# Patient Record
Sex: Female | Born: 1980 | Race: White | Hispanic: No | Marital: Single | Smoking: Current every day smoker
Health system: Southern US, Community
[De-identification: ages and names within clinical notes are randomized; demographics above are authoritative.]

## PROBLEM LIST (undated history)

## (undated) DIAGNOSIS — M1990 Unspecified osteoarthritis, unspecified site: Secondary | ICD-10-CM

## (undated) HISTORY — PX: CERVICAL CONE BIOPSY: SUR198

---

## 2011-02-27 ENCOUNTER — Emergency Department (HOSPITAL_COMMUNITY): Payer: Medicaid Other

## 2011-02-27 ENCOUNTER — Encounter (HOSPITAL_COMMUNITY): Payer: Self-pay | Admitting: *Deleted

## 2011-02-27 ENCOUNTER — Emergency Department (HOSPITAL_COMMUNITY)
Admission: EM | Admit: 2011-02-27 | Discharge: 2011-02-27 | Disposition: A | Discharge status: Home / Self Care | Payer: Medicaid Other | Attending: Emergency Medicine | Admitting: Emergency Medicine

## 2011-02-27 DIAGNOSIS — Y92009 Unspecified place in unspecified non-institutional (private) residence as the place of occurrence of the external cause: Secondary | ICD-10-CM | POA: Insufficient documentation

## 2011-02-27 DIAGNOSIS — F17200 Nicotine dependence, unspecified, uncomplicated: Secondary | ICD-10-CM | POA: Insufficient documentation

## 2011-02-27 DIAGNOSIS — W19XXXA Unspecified fall, initial encounter: Secondary | ICD-10-CM | POA: Insufficient documentation

## 2011-02-27 DIAGNOSIS — S93409A Sprain of unspecified ligament of unspecified ankle, initial encounter: Secondary | ICD-10-CM | POA: Insufficient documentation

## 2011-02-27 MED ORDER — HYDROCODONE-ACETAMINOPHEN 5-325 MG PO TABS: ORAL_TABLET | ORAL | Status: DC

## 2011-02-27 MED ORDER — PROMETHAZINE HCL 12.5 MG PO TABS
12.5000 mg | ORAL_TABLET | Freq: Once | ORAL | Status: AC
  Administered 2011-02-27: 12.5 mg via ORAL
  Filled 2011-02-27: qty 1

## 2011-02-27 MED ORDER — HYDROCODONE-ACETAMINOPHEN 5-325 MG PO TABS
2.0000 | ORAL_TABLET | Freq: Once | ORAL | Status: AC
  Administered 2011-02-27: 2 via ORAL
  Filled 2011-02-27: qty 2

## 2011-02-27 MED ORDER — IBUPROFEN 800 MG PO TABS
800.0000 mg | ORAL_TABLET | Freq: Once | ORAL | Status: AC
  Administered 2011-02-27: 800 mg via ORAL
  Filled 2011-02-27: qty 1

## 2011-02-27 MED ORDER — MELOXICAM 7.5 MG PO TABS: ORAL_TABLET | ORAL | Status: DC

## 2011-02-27 NOTE — ED Provider Notes (Signed)
History     CSN: [id number]  Arrival date & time 02/27/11  1211   None     Chief Complaint  Patient presents with  . Ankle Pain    (Consider location/radiation/quality/duration/timing/severity/associated sxs/prior treatment) Patient is a 31 y.o. female presenting with ankle pain. The history is provided by the patient.  Ankle Pain  The incident occurred yesterday. The incident occurred at home. The injury mechanism was a fall. The pain is present in the left ankle. The pain is severe. The pain has been constant since onset. Associated symptoms include inability to bear weight. She reports no foreign bodies present. The symptoms are aggravated by bearing weight. She has tried nothing for the symptoms.    History reviewed. No pertinent past medical history.  No past surgical history on file.  No family history on file.  History  Substance Use Topics  . Smoking status: Current Everyday Smoker -- 1.0 packs/day    Types: Cigarettes  . Smokeless tobacco: Not on file  . Alcohol Use: No    OB History    Grav Para Term Preterm Abortions TAB SAB Ect Mult Living                  Review of Systems  Constitutional: Negative for activity change.       All ROS Neg except as noted in HPI  HENT: Negative for nosebleeds and neck pain.   Eyes: Negative for photophobia and discharge.  Respiratory: Negative for cough, shortness of breath and wheezing.   Cardiovascular: Negative for chest pain and palpitations.  Gastrointestinal: Negative for abdominal pain and blood in stool.  Genitourinary: Negative for dysuria, frequency and hematuria.  Musculoskeletal: Negative for back pain and arthralgias.  Skin: Negative.   Neurological: Negative for dizziness, seizures and speech difficulty.  Psychiatric/Behavioral: Negative for hallucinations and confusion.    Allergies  Review of patient's allergies indicates no known allergies.  Home Medications   Current Outpatient Rx  Name Route  Sig Dispense Refill  . HYDROCODONE-ACETAMINOPHEN 5-325 MG PO TABS  1 or 2 po q4h prn pain 20 tablet 0  . MELOXICAM 7.5 MG PO TABS  1 po bid with food 14 tablet 0    BP 128/89  Pulse 100  Temp 97.9 F (36.6 C)  Resp 20  Ht 5\' 4"  (1.626 m)  Wt 198 lb (89.812 kg)  BMI 33.99 kg/m2  SpO2 100%  LMP 02/17/2011  Physical Exam  Nursing note and vitals reviewed. Constitutional: She is oriented to person, place, and time. She appears well-developed and well-nourished.  Non-toxic appearance.  HENT:  Head: Normocephalic.  Right Ear: Tympanic membrane and external ear normal.  Left Ear: Tympanic membrane and external ear normal.  Eyes: EOM and lids are normal. Pupils are equal, round, and reactive to light.  Neck: Normal range of motion. Neck supple. Carotid bruit is not present.  Cardiovascular: Regular rhythm, normal heart sounds, intact distal pulses and normal pulses.   Pulmonary/Chest: Breath sounds normal. No respiratory distress.  Abdominal: Soft. Bowel sounds are normal. There is no tenderness. There is no guarding.  Musculoskeletal: Normal range of motion.       There is swelling and bruising of the left lateral malleolus. There is good capillary refill of the toes. The Achilles tendon is intact. There is soreness and mild swelling of the left knee. The patella tracks well. No deformity of the quads.  Lymphadenopathy:       Head (right side): No submandibular adenopathy present.  Head (left side): No submandibular adenopathy present.    She has no cervical adenopathy.  Neurological: She is alert and oriented to person, place, and time. She has normal strength. No cranial nerve deficit or sensory deficit.  Skin: Skin is warm and dry.  Psychiatric: She has a normal mood and affect. Her speech is normal.    ED Course  Procedures (including critical care time) Pulse oximetry 100% on room air. Within normal limits by my interpretation. Labs Reviewed - No data to display Dg Ankle  Complete Left  02/27/2011  *RADIOLOGY REPORT*  Clinical Data: 31 year old female with left ankle pain following injury.  LEFT ANKLE COMPLETE - 3+ VIEW  Comparison: None  Findings: No evidence of acute fracture, subluxation or dislocation identified.  No radio-opaque foreign bodies are present.  No focal bony lesions are noted.  The joint spaces are unremarkable.  Soft tissue swelling laterally is identified.  IMPRESSION: Soft tissue swelling without acute bony abnormality.  Original Report Authenticated By: Rosendo Gros, M.D.     1. Ankle sprain       MDM  I have reviewed nursing notes, vital signs, and all appropriate lab and imaging results for this patient. Test results, and exam results discussed with the patient in detail. The plan at this time is an ankle splint, ice, elevation, and use of crutches. Prescription for Norco 5 mg, and Mobic 7.5 mg given to the patient. The patient is to be followed by orthopedics if this is not improving.       Kathie Dike, [place] 02/27/11 1359
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
## 2011-02-27 NOTE — ED Notes (Signed)
MD at bedside. 
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
## 2011-02-27 NOTE — Discharge Instructions (Signed)
Please apply ice and elevate your ankle. Your xrays are negative for fracture or dislocation. Please see the orthopedic MD listed above if not improving. Mobic daily with food for inflammation and swelling. Norco for pain if needed. This medication may cause drowsiness.Ankle Sprain You have a sprained ankle. When you twist or sprain your ankle, the ligaments that hold the joint together are injured. This usually causes a lot of swelling and pain. Although these injuries can be quite severe, proper treatment will reduce your pain, shorten the period of disability, and help prevent re-injury. To treat a sprained ankle you should:  Elevate your ankle for the next 2 to 4 days.   During this period apply ice packs to the injury for 20 to 30 minutes every 2 to 3 hours.   Keep the ankle wrapped in a compression bandage or splint as long as it is painful or swollen.   Do not walk on your ankle if it still hurts. This can slow the healing. Gentle range of motion exercises, however, can help decrease disability.   Use crutches if necessary until weight bearing is painless.   Prescription pain medicine may be needed to relieve discomfort.  A plaster or fiberglass splint may be applied initially. As your sprain improves, air, foam or gel-lined braces can be used to protect the ankle from further injury until the joint is completely healed. Ankle rehabilitation exercises may also be used to speed your recovery and make the joint more stable. Most moderate ankle sprains will heal completely in 6 weeks. However, if the sprain is severe, a cast or even surgery may be needed. Restrict your activities and see your doctor for follow-up as advised. If you have persistent pain, further evaluation and x-rays may be needed. Document Released: 01/30/2004 Document Revised: 09/03/2010 Document Reviewed: 12/24/2007 [facility] Patient Information 2012 [place], [place].

## 2011-02-28 NOTE — ED Provider Notes (Signed)
Medical screening examination/treatment/procedure(s) were performed by non-physician practitioner and as supervising physician I was immediately available for consultation/collaboration.  Shelda Jakes, MD 02/28/11 [phone]

## 2011-07-28 ENCOUNTER — Emergency Department (HOSPITAL_COMMUNITY): Payer: Medicaid Other

## 2011-07-28 ENCOUNTER — Encounter (HOSPITAL_COMMUNITY): Payer: Self-pay

## 2011-07-28 ENCOUNTER — Emergency Department (HOSPITAL_COMMUNITY)
Admission: EM | Admit: 2011-07-28 | Discharge: 2011-07-28 | Disposition: A | Discharge status: Home / Self Care | Payer: Medicaid Other | Attending: Emergency Medicine | Admitting: Emergency Medicine

## 2011-07-28 DIAGNOSIS — R6884 Jaw pain: Secondary | ICD-10-CM | POA: Insufficient documentation

## 2011-07-28 DIAGNOSIS — R209 Unspecified disturbances of skin sensation: Secondary | ICD-10-CM | POA: Insufficient documentation

## 2011-07-28 DIAGNOSIS — H538 Other visual disturbances: Secondary | ICD-10-CM | POA: Insufficient documentation

## 2011-07-28 DIAGNOSIS — F17200 Nicotine dependence, unspecified, uncomplicated: Secondary | ICD-10-CM | POA: Insufficient documentation

## 2011-07-28 DIAGNOSIS — S0003XA Contusion of scalp, initial encounter: Secondary | ICD-10-CM | POA: Insufficient documentation

## 2011-07-28 DIAGNOSIS — R51 Headache: Secondary | ICD-10-CM | POA: Insufficient documentation

## 2011-07-28 DIAGNOSIS — S0083XA Contusion of other part of head, initial encounter: Secondary | ICD-10-CM

## 2011-07-28 DIAGNOSIS — Q7649 Other congenital malformations of spine, not associated with scoliosis: Secondary | ICD-10-CM | POA: Insufficient documentation

## 2011-07-28 MED ORDER — OXYCODONE-ACETAMINOPHEN 5-325 MG PO TABS
1.0000 | ORAL_TABLET | Freq: Once | ORAL | Status: AC
  Administered 2011-07-28: 1 via ORAL
  Filled 2011-07-28: qty 1

## 2011-07-28 MED ORDER — OXYCODONE-ACETAMINOPHEN 5-325 MG PO TABS: 1.0000 | ORAL_TABLET | Freq: Four times a day (QID) | ORAL | Status: AC | PRN
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
## 2011-07-28 NOTE — ED Notes (Signed)
Patient requesting pain medication. Dr Deretha Emory aware, awaiting orders.
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
## 2011-12-20 ENCOUNTER — Emergency Department (HOSPITAL_COMMUNITY): Payer: Medicaid Other

## 2011-12-20 ENCOUNTER — Encounter (HOSPITAL_COMMUNITY): Payer: Self-pay | Admitting: Emergency Medicine

## 2011-12-20 ENCOUNTER — Emergency Department (HOSPITAL_COMMUNITY)
Admission: EM | Admit: 2011-12-20 | Discharge: 2011-12-20 | Disposition: A | Discharge status: Home / Self Care | Payer: Medicaid Other | Attending: Emergency Medicine | Admitting: Emergency Medicine

## 2011-12-20 DIAGNOSIS — R51 Headache: Secondary | ICD-10-CM

## 2011-12-20 DIAGNOSIS — Z79899 Other long term (current) drug therapy: Secondary | ICD-10-CM | POA: Insufficient documentation

## 2011-12-20 DIAGNOSIS — F17200 Nicotine dependence, unspecified, uncomplicated: Secondary | ICD-10-CM | POA: Insufficient documentation

## 2011-12-20 MED ORDER — OXYCODONE-ACETAMINOPHEN 5-325 MG PO TABS
2.0000 | ORAL_TABLET | Freq: Once | ORAL | Status: AC
  Administered 2011-12-20: 2 via ORAL
  Filled 2011-12-20: qty 2

## 2011-12-20 NOTE — ED Notes (Signed)
Patient resting with eyes closed. NAD noted.
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
## 2011-12-20 NOTE — ED Notes (Signed)
Dr Ray at bedside. 
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
## 2011-12-20 NOTE — ED Notes (Signed)
Patient noted to be sitting in bed, smiling, holding a conversation with family. No distress noted. Will continue to monitor.

## 2013-08-01 ENCOUNTER — Ambulatory Visit: Payer: Medicaid Other | Admitting: Neurology

## 2013-08-15 ENCOUNTER — Emergency Department (HOSPITAL_COMMUNITY)
Admission: EM | Admit: 2013-08-15 | Discharge: 2013-08-15 | Discharge status: Against Medical Advice | Payer: Medicaid Other | Attending: Emergency Medicine | Admitting: Emergency Medicine

## 2013-08-15 ENCOUNTER — Encounter (HOSPITAL_COMMUNITY): Payer: Self-pay | Admitting: Emergency Medicine

## 2013-08-15 DIAGNOSIS — M549 Dorsalgia, unspecified: Secondary | ICD-10-CM | POA: Insufficient documentation

## 2013-08-15 DIAGNOSIS — M1990 Unspecified osteoarthritis, unspecified site: Secondary | ICD-10-CM | POA: Insufficient documentation

## 2013-08-15 DIAGNOSIS — F17200 Nicotine dependence, unspecified, uncomplicated: Secondary | ICD-10-CM | POA: Insufficient documentation

## 2013-08-15 HISTORY — DX: Unspecified osteoarthritis, unspecified site: M19.90

## 2013-08-15 LAB — URINALYSIS, ROUTINE W REFLEX MICROSCOPIC
BILIRUBIN URINE: NEGATIVE
Glucose, UA: NEGATIVE mg/dL
KETONES UR: NEGATIVE mg/dL
NITRITE: NEGATIVE
Protein, ur: NEGATIVE mg/dL
Specific Gravity, Urine: <1.005 — ABNORMAL LOW (ref 1.005–1.030)
UROBILINOGEN UA: 0.2 mg/dL (ref 0.0–1.0)
pH: 6.5 (ref 5.0–8.0)

## 2013-08-15 LAB — RAPID URINE DRUG SCREEN, HOSP PERFORMED
Amphetamines: POSITIVE — AB
BARBITURATES: NOT DETECTED
Benzodiazepines: POSITIVE — AB
Cocaine: NOT DETECTED
Opiates: NOT DETECTED
Tetrahydrocannabinol: NOT DETECTED

## 2013-08-15 LAB — URINE MICROSCOPIC-ADD ON

## 2013-08-15 NOTE — ED Notes (Signed)
Patient left without seeing the md. Stated she was leaving. Informed her that the md would be in as soon as  Possible  But she left without sign ama papers

## 2013-08-15 NOTE — ED Notes (Addendum)
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  States that she ran out of her medication.  Pt states that [place] months ago and is unable to get to pain management physician.  Pt reports that she takes Dilaudid every 8 hours for pain, and Methadone for breakthrough pain.

## 2014-06-03 IMAGING — CT CT HEAD W/O CM
1 series · 16 of 30 positions shown, 20 images · non-contrast
Comparison: CT scan dated 07/28/2011

CLINICAL DATA: Persistent headaches since trauma in July 2011

CT HEAD WITHOUT CONTRAST
TECHNIQUE: Contiguous axial images were obtained from the base of
the skull through the vertex without contrast.

[Series 2: headtrauma 4.8 h37s · axial · 0.45mm/px · z∈[+78,+232]mm · 16 of 36 slices shown, 20 images]
[im 2/36  brain]
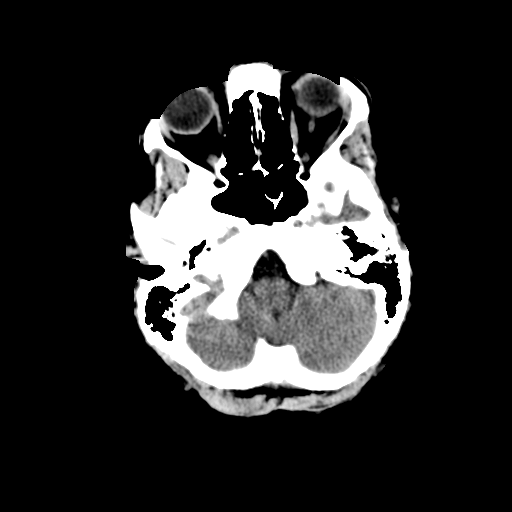
[im 2/36  bone]
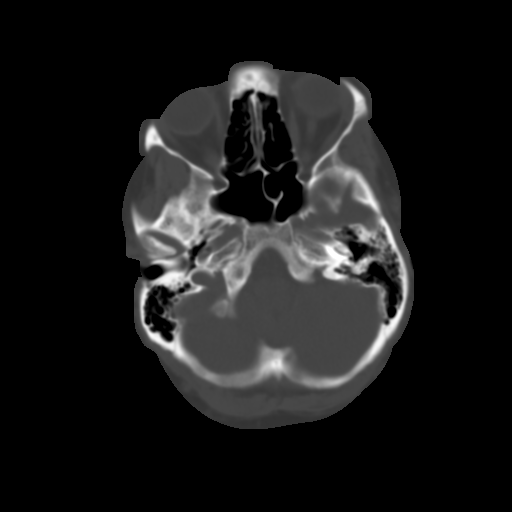
[im 4/36  brain]
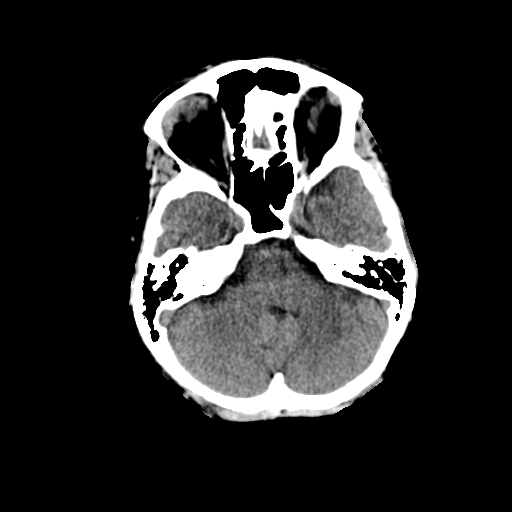
[im 7/36  brain]
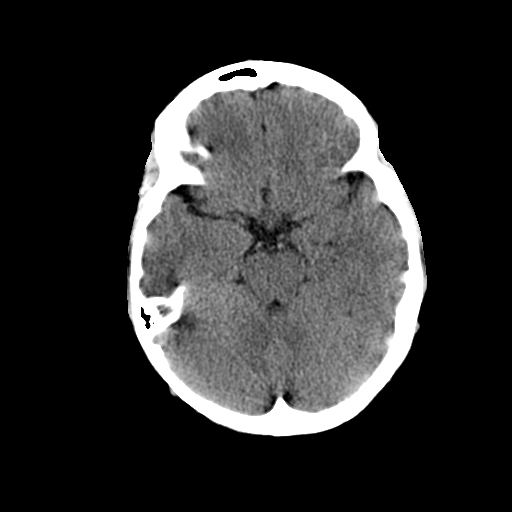
[im 9/36  brain]
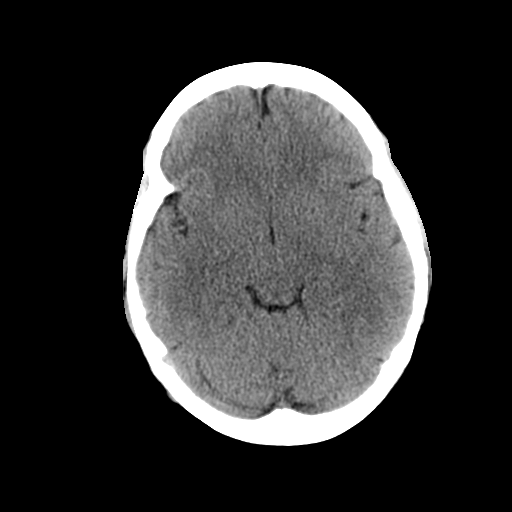
[im 10/36  brain]
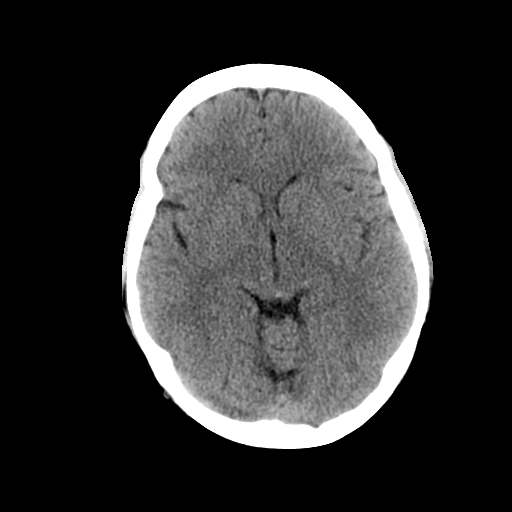
[im 10/36  bone]
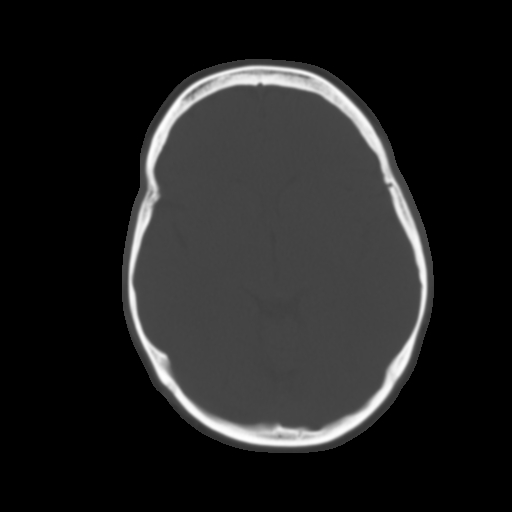
[im 13/36  brain]
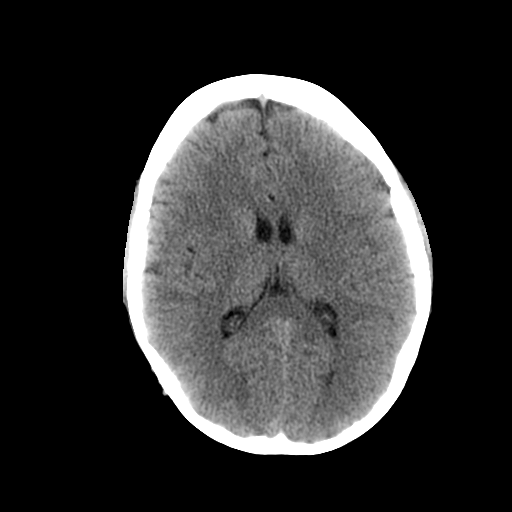
[im 15/36  brain]
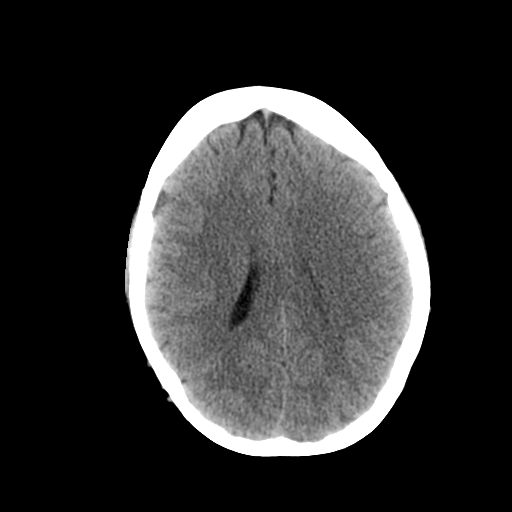
[im 17/36  brain]
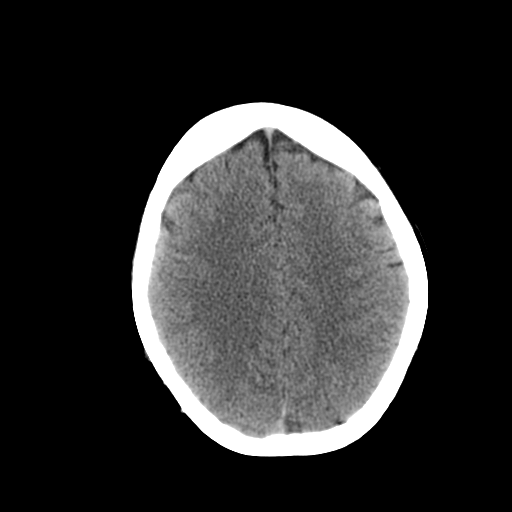
[im 19/36  brain]
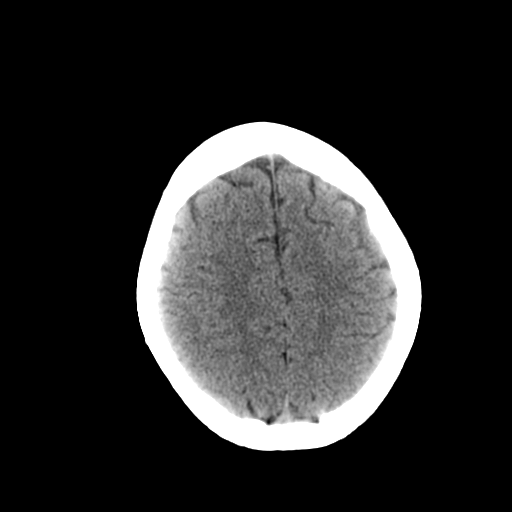
[im 19/36  bone]
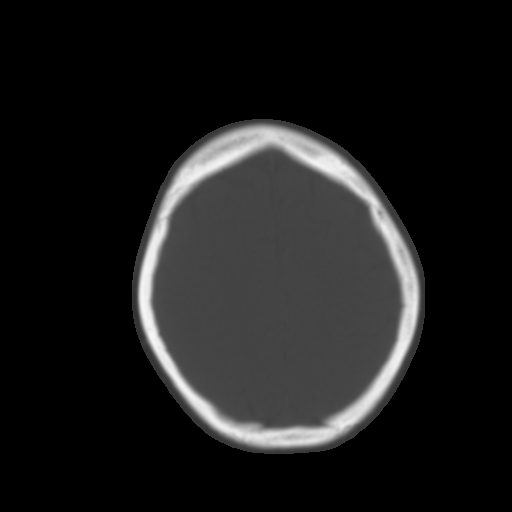
[im 21/36  brain]
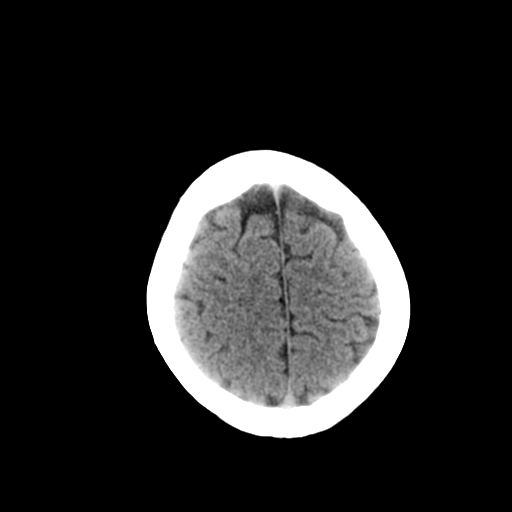
[im 23/36  brain]
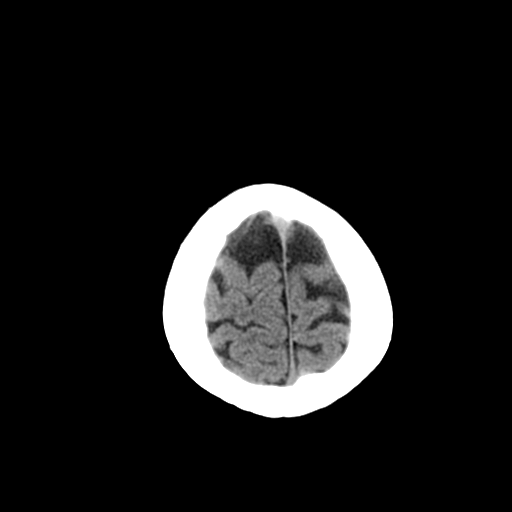
[im 26/36  brain]
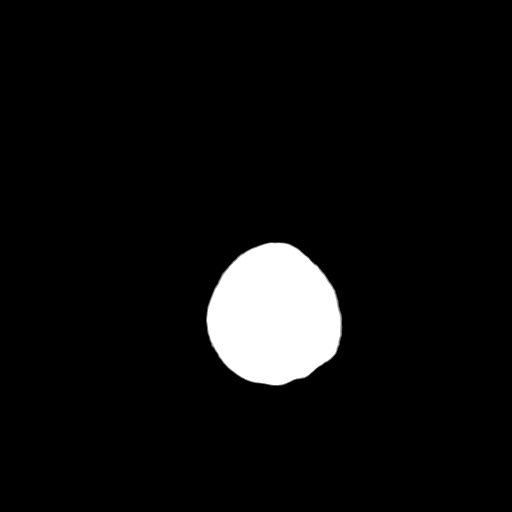
[im 27/36  brain]
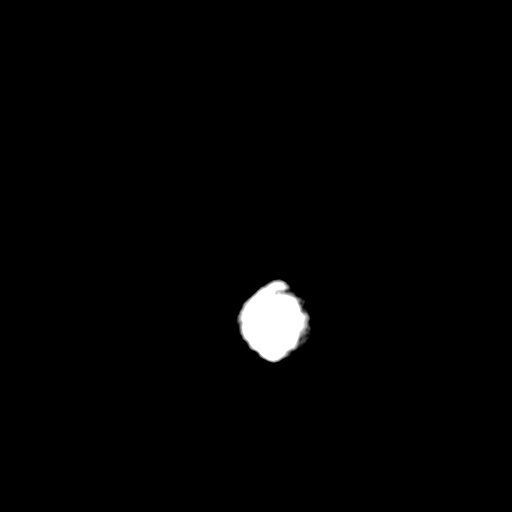
[im 27/36  bone]
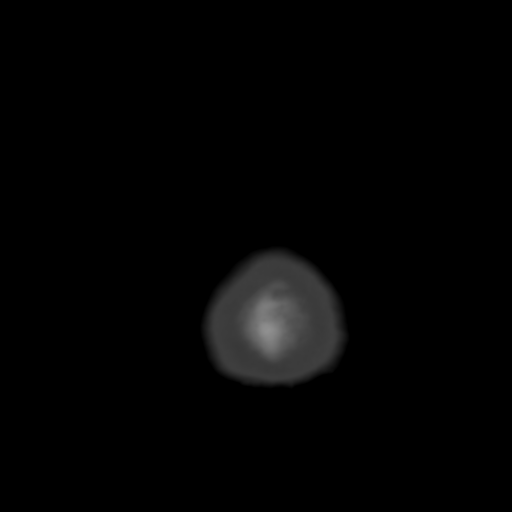
[im 29/36  brain]
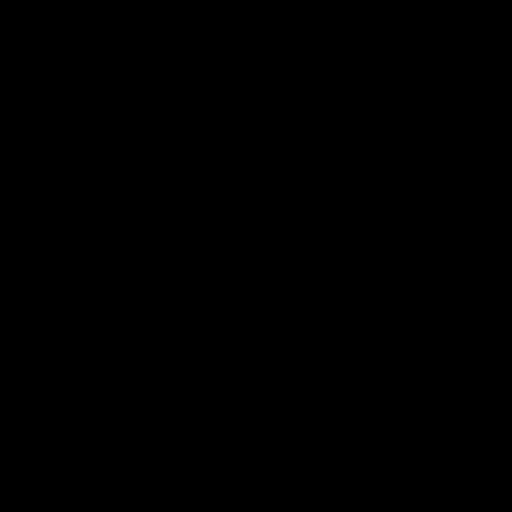
[im 32/36  brain]
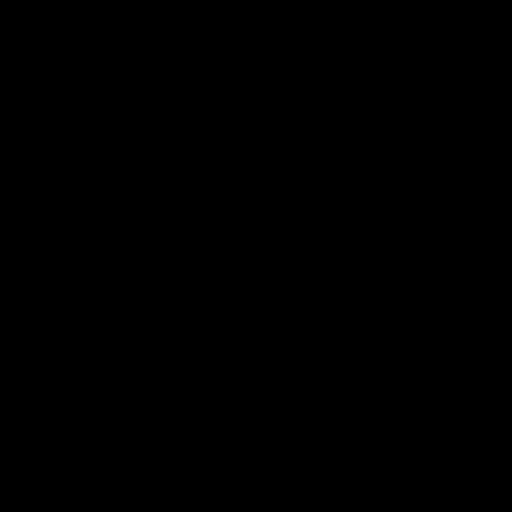
[im 34/36  brain]
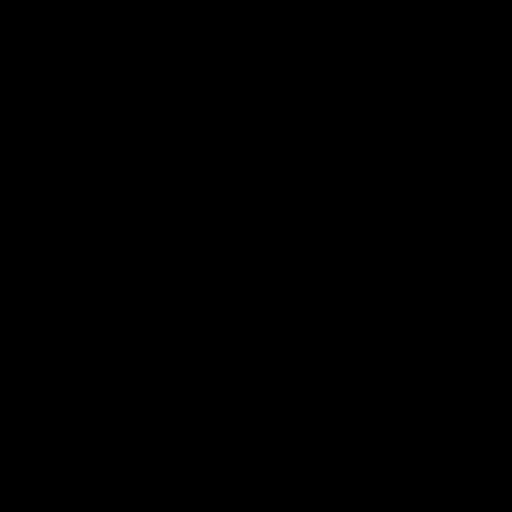

[16 of 30 positions shown; findings below may reference images not displayed]

FINDINGS: There is no acute intracranial hemorrhage, infarction, or
mass lesion.  Brain parenchyma is normal.  Ventricles are normal.
Osseous structures are normal.
IMPRESSION: Normal exam.

## 2015-01-22 ENCOUNTER — Telehealth (HOSPITAL_COMMUNITY): Payer: Self-pay | Admitting: *Deleted

## 2017-04-15 ENCOUNTER — Encounter: Payer: Self-pay | Admitting: Gastroenterology

## 2017-07-01 ENCOUNTER — Telehealth: Payer: Self-pay | Admitting: Gastroenterology

## 2017-07-01 ENCOUNTER — Ambulatory Visit: Payer: Self-pay | Admitting: Gastroenterology

## 2017-07-01 ENCOUNTER — Encounter: Payer: Self-pay | Admitting: Gastroenterology

## 2017-07-01 NOTE — Telephone Encounter (Signed)
PATIENT WAS A NO SHOW AND LETTER SENT  °

## 2021-09-23 DIAGNOSIS — Z79899 Other long term (current) drug therapy: Secondary | ICD-10-CM | POA: Diagnosis not present

## 2021-09-30 DIAGNOSIS — Z79899 Other long term (current) drug therapy: Secondary | ICD-10-CM | POA: Diagnosis not present

## 2021-11-07 DIAGNOSIS — R69 Illness, unspecified: Secondary | ICD-10-CM | POA: Diagnosis not present

## 2021-11-07 DIAGNOSIS — Z79899 Other long term (current) drug therapy: Secondary | ICD-10-CM | POA: Diagnosis not present

## 2021-11-14 DIAGNOSIS — F411 Generalized anxiety disorder: Secondary | ICD-10-CM | POA: Diagnosis not present

## 2021-11-14 DIAGNOSIS — F13120 Sedative, hypnotic or anxiolytic abuse with intoxication, uncomplicated: Secondary | ICD-10-CM | POA: Diagnosis not present

## 2021-11-14 DIAGNOSIS — R69 Illness, unspecified: Secondary | ICD-10-CM | POA: Diagnosis not present

## 2021-11-14 DIAGNOSIS — F900 Attention-deficit hyperactivity disorder, predominantly inattentive type: Secondary | ICD-10-CM | POA: Diagnosis not present

## 2021-11-14 DIAGNOSIS — Z79899 Other long term (current) drug therapy: Secondary | ICD-10-CM | POA: Diagnosis not present

## 2021-11-14 DIAGNOSIS — F32A Depression, unspecified: Secondary | ICD-10-CM | POA: Diagnosis not present

## 2021-11-14 DIAGNOSIS — F1124 Opioid dependence with opioid-induced mood disorder: Secondary | ICD-10-CM | POA: Diagnosis not present

## 2021-11-19 DIAGNOSIS — F1124 Opioid dependence with opioid-induced mood disorder: Secondary | ICD-10-CM | POA: Diagnosis not present

## 2021-11-19 DIAGNOSIS — Z79899 Other long term (current) drug therapy: Secondary | ICD-10-CM | POA: Diagnosis not present

## 2021-11-26 DIAGNOSIS — Z833 Family history of diabetes mellitus: Secondary | ICD-10-CM | POA: Diagnosis not present

## 2021-11-26 DIAGNOSIS — F4310 Post-traumatic stress disorder, unspecified: Secondary | ICD-10-CM | POA: Diagnosis not present

## 2021-11-26 DIAGNOSIS — Z720 Tobacco use: Secondary | ICD-10-CM | POA: Diagnosis not present

## 2021-11-26 DIAGNOSIS — Z6838 Body mass index (BMI) 38.0-38.9, adult: Secondary | ICD-10-CM | POA: Diagnosis not present

## 2021-11-26 DIAGNOSIS — F419 Anxiety disorder, unspecified: Secondary | ICD-10-CM | POA: Diagnosis not present

## 2021-11-26 DIAGNOSIS — I10 Essential (primary) hypertension: Secondary | ICD-10-CM | POA: Diagnosis not present

## 2021-11-26 DIAGNOSIS — R69 Illness, unspecified: Secondary | ICD-10-CM | POA: Diagnosis not present

## 2021-11-26 DIAGNOSIS — F902 Attention-deficit hyperactivity disorder, combined type: Secondary | ICD-10-CM | POA: Diagnosis not present

## 2021-11-26 DIAGNOSIS — Z8249 Family history of ischemic heart disease and other diseases of the circulatory system: Secondary | ICD-10-CM | POA: Diagnosis not present

## 2021-11-26 DIAGNOSIS — Z818 Family history of other mental and behavioral disorders: Secondary | ICD-10-CM | POA: Diagnosis not present

## 2021-12-03 DIAGNOSIS — Z79899 Other long term (current) drug therapy: Secondary | ICD-10-CM | POA: Diagnosis not present

## 2021-12-03 DIAGNOSIS — F1124 Opioid dependence with opioid-induced mood disorder: Secondary | ICD-10-CM | POA: Diagnosis not present

## 2021-12-10 DIAGNOSIS — Z79899 Other long term (current) drug therapy: Secondary | ICD-10-CM | POA: Diagnosis not present

## 2021-12-10 DIAGNOSIS — R69 Illness, unspecified: Secondary | ICD-10-CM | POA: Diagnosis not present

## 2021-12-15 DIAGNOSIS — F1124 Opioid dependence with opioid-induced mood disorder: Secondary | ICD-10-CM | POA: Diagnosis not present

## 2021-12-15 DIAGNOSIS — Z79899 Other long term (current) drug therapy: Secondary | ICD-10-CM | POA: Diagnosis not present

## 2021-12-22 DIAGNOSIS — Z5181 Encounter for therapeutic drug level monitoring: Secondary | ICD-10-CM | POA: Diagnosis not present

## 2021-12-22 DIAGNOSIS — F32A Depression, unspecified: Secondary | ICD-10-CM | POA: Diagnosis not present

## 2021-12-22 DIAGNOSIS — F900 Attention-deficit hyperactivity disorder, predominantly inattentive type: Secondary | ICD-10-CM | POA: Diagnosis not present

## 2021-12-22 DIAGNOSIS — Z79899 Other long term (current) drug therapy: Secondary | ICD-10-CM | POA: Diagnosis not present

## 2021-12-22 DIAGNOSIS — R69 Illness, unspecified: Secondary | ICD-10-CM | POA: Diagnosis not present

## 2021-12-22 DIAGNOSIS — F1124 Opioid dependence with opioid-induced mood disorder: Secondary | ICD-10-CM | POA: Diagnosis not present

## 2021-12-31 DIAGNOSIS — F13120 Sedative, hypnotic or anxiolytic abuse with intoxication, uncomplicated: Secondary | ICD-10-CM | POA: Diagnosis not present

## 2021-12-31 DIAGNOSIS — F900 Attention-deficit hyperactivity disorder, predominantly inattentive type: Secondary | ICD-10-CM | POA: Diagnosis not present

## 2021-12-31 DIAGNOSIS — Z79891 Long term (current) use of opiate analgesic: Secondary | ICD-10-CM | POA: Diagnosis not present

## 2021-12-31 DIAGNOSIS — Z79899 Other long term (current) drug therapy: Secondary | ICD-10-CM | POA: Diagnosis not present

## 2021-12-31 DIAGNOSIS — F1124 Opioid dependence with opioid-induced mood disorder: Secondary | ICD-10-CM | POA: Diagnosis not present

## 2021-12-31 DIAGNOSIS — R69 Illness, unspecified: Secondary | ICD-10-CM | POA: Diagnosis not present

## 2021-12-31 DIAGNOSIS — F411 Generalized anxiety disorder: Secondary | ICD-10-CM | POA: Diagnosis not present

## 2021-12-31 DIAGNOSIS — F32A Depression, unspecified: Secondary | ICD-10-CM | POA: Diagnosis not present

## 2022-01-07 DIAGNOSIS — F32A Depression, unspecified: Secondary | ICD-10-CM | POA: Diagnosis not present

## 2022-01-07 DIAGNOSIS — R69 Illness, unspecified: Secondary | ICD-10-CM | POA: Diagnosis not present

## 2022-01-07 DIAGNOSIS — F411 Generalized anxiety disorder: Secondary | ICD-10-CM | POA: Diagnosis not present

## 2022-01-07 DIAGNOSIS — F1124 Opioid dependence with opioid-induced mood disorder: Secondary | ICD-10-CM | POA: Diagnosis not present

## 2022-01-07 DIAGNOSIS — Z5181 Encounter for therapeutic drug level monitoring: Secondary | ICD-10-CM | POA: Diagnosis not present

## 2022-01-07 DIAGNOSIS — Z79899 Other long term (current) drug therapy: Secondary | ICD-10-CM | POA: Diagnosis not present

## 2022-01-07 DIAGNOSIS — F900 Attention-deficit hyperactivity disorder, predominantly inattentive type: Secondary | ICD-10-CM | POA: Diagnosis not present

## 2022-01-07 DIAGNOSIS — F13120 Sedative, hypnotic or anxiolytic abuse with intoxication, uncomplicated: Secondary | ICD-10-CM | POA: Diagnosis not present

## 2022-01-13 DIAGNOSIS — F1124 Opioid dependence with opioid-induced mood disorder: Secondary | ICD-10-CM | POA: Diagnosis not present

## 2022-01-13 DIAGNOSIS — R69 Illness, unspecified: Secondary | ICD-10-CM | POA: Diagnosis not present

## 2022-01-13 DIAGNOSIS — Z79899 Other long term (current) drug therapy: Secondary | ICD-10-CM | POA: Diagnosis not present

## 2022-01-13 DIAGNOSIS — F32A Depression, unspecified: Secondary | ICD-10-CM | POA: Diagnosis not present

## 2022-01-13 DIAGNOSIS — F13120 Sedative, hypnotic or anxiolytic abuse with intoxication, uncomplicated: Secondary | ICD-10-CM | POA: Diagnosis not present

## 2022-01-13 DIAGNOSIS — F411 Generalized anxiety disorder: Secondary | ICD-10-CM | POA: Diagnosis not present

## 2022-01-13 DIAGNOSIS — F900 Attention-deficit hyperactivity disorder, predominantly inattentive type: Secondary | ICD-10-CM | POA: Diagnosis not present

## 2022-01-13 DIAGNOSIS — Z5181 Encounter for therapeutic drug level monitoring: Secondary | ICD-10-CM | POA: Diagnosis not present

## 2022-01-19 DIAGNOSIS — F900 Attention-deficit hyperactivity disorder, predominantly inattentive type: Secondary | ICD-10-CM | POA: Diagnosis not present

## 2022-01-19 DIAGNOSIS — Z79899 Other long term (current) drug therapy: Secondary | ICD-10-CM | POA: Diagnosis not present

## 2022-01-19 DIAGNOSIS — R69 Illness, unspecified: Secondary | ICD-10-CM | POA: Diagnosis not present

## 2022-01-19 DIAGNOSIS — F32A Depression, unspecified: Secondary | ICD-10-CM | POA: Diagnosis not present

## 2022-01-19 DIAGNOSIS — F411 Generalized anxiety disorder: Secondary | ICD-10-CM | POA: Diagnosis not present

## 2022-01-19 DIAGNOSIS — Z5181 Encounter for therapeutic drug level monitoring: Secondary | ICD-10-CM | POA: Diagnosis not present

## 2022-01-19 DIAGNOSIS — F13120 Sedative, hypnotic or anxiolytic abuse with intoxication, uncomplicated: Secondary | ICD-10-CM | POA: Diagnosis not present

## 2022-01-19 DIAGNOSIS — F1124 Opioid dependence with opioid-induced mood disorder: Secondary | ICD-10-CM | POA: Diagnosis not present

## 2022-01-26 DIAGNOSIS — F900 Attention-deficit hyperactivity disorder, predominantly inattentive type: Secondary | ICD-10-CM | POA: Diagnosis not present

## 2022-01-26 DIAGNOSIS — Z5181 Encounter for therapeutic drug level monitoring: Secondary | ICD-10-CM | POA: Diagnosis not present

## 2022-01-26 DIAGNOSIS — F13120 Sedative, hypnotic or anxiolytic abuse with intoxication, uncomplicated: Secondary | ICD-10-CM | POA: Diagnosis not present

## 2022-01-26 DIAGNOSIS — F32A Depression, unspecified: Secondary | ICD-10-CM | POA: Diagnosis not present

## 2022-01-26 DIAGNOSIS — F411 Generalized anxiety disorder: Secondary | ICD-10-CM | POA: Diagnosis not present

## 2022-01-26 DIAGNOSIS — F1124 Opioid dependence with opioid-induced mood disorder: Secondary | ICD-10-CM | POA: Diagnosis not present

## 2022-01-26 DIAGNOSIS — Z79899 Other long term (current) drug therapy: Secondary | ICD-10-CM | POA: Diagnosis not present

## 2022-01-26 DIAGNOSIS — R69 Illness, unspecified: Secondary | ICD-10-CM | POA: Diagnosis not present

## 2023-09-26 ENCOUNTER — Emergency Department (HOSPITAL_COMMUNITY): Payer: Medicaid - Out of State

## 2023-09-26 ENCOUNTER — Inpatient Hospital Stay (HOSPITAL_COMMUNITY)
Admission: EM | Admit: 2023-09-26 | Discharge: 2023-09-30 | DRG: 871 | Disposition: A | Discharge status: Home / Self Care | Payer: Medicaid - Out of State | Attending: Internal Medicine | Admitting: Internal Medicine

## 2023-09-26 ENCOUNTER — Other Ambulatory Visit: Payer: Self-pay

## 2023-09-26 ENCOUNTER — Encounter (HOSPITAL_COMMUNITY): Payer: Self-pay

## 2023-09-26 DIAGNOSIS — F17210 Nicotine dependence, cigarettes, uncomplicated: Secondary | ICD-10-CM | POA: Diagnosis present

## 2023-09-26 DIAGNOSIS — R188 Other ascites: Secondary | ICD-10-CM | POA: Diagnosis present

## 2023-09-26 DIAGNOSIS — K828 Other specified diseases of gallbladder: Secondary | ICD-10-CM | POA: Diagnosis present

## 2023-09-26 DIAGNOSIS — K56609 Unspecified intestinal obstruction, unspecified as to partial versus complete obstruction: Secondary | ICD-10-CM | POA: Diagnosis present

## 2023-09-26 DIAGNOSIS — E871 Hypo-osmolality and hyponatremia: Secondary | ICD-10-CM | POA: Diagnosis present

## 2023-09-26 DIAGNOSIS — A419 Sepsis, unspecified organism: Principal | ICD-10-CM | POA: Diagnosis present

## 2023-09-26 DIAGNOSIS — R6521 Severe sepsis with septic shock: Secondary | ICD-10-CM | POA: Diagnosis present

## 2023-09-26 DIAGNOSIS — Z1152 Encounter for screening for COVID-19: Secondary | ICD-10-CM

## 2023-09-26 DIAGNOSIS — Z98891 History of uterine scar from previous surgery: Secondary | ICD-10-CM

## 2023-09-26 DIAGNOSIS — D75839 Thrombocytosis, unspecified: Secondary | ICD-10-CM | POA: Diagnosis present

## 2023-09-26 DIAGNOSIS — N390 Urinary tract infection, site not specified: Secondary | ICD-10-CM | POA: Diagnosis present

## 2023-09-26 DIAGNOSIS — R7989 Other specified abnormal findings of blood chemistry: Secondary | ICD-10-CM | POA: Insufficient documentation

## 2023-09-26 DIAGNOSIS — Z885 Allergy status to narcotic agent status: Secondary | ICD-10-CM | POA: Diagnosis not present

## 2023-09-26 DIAGNOSIS — Z87440 Personal history of urinary (tract) infections: Secondary | ICD-10-CM

## 2023-09-26 DIAGNOSIS — K567 Ileus, unspecified: Secondary | ICD-10-CM | POA: Diagnosis present

## 2023-09-26 DIAGNOSIS — I959 Hypotension, unspecified: Secondary | ICD-10-CM | POA: Diagnosis present

## 2023-09-26 DIAGNOSIS — R6510 Systemic inflammatory response syndrome (SIRS) of non-infectious origin without acute organ dysfunction: Secondary | ICD-10-CM | POA: Diagnosis not present

## 2023-09-26 DIAGNOSIS — N179 Acute kidney failure, unspecified: Secondary | ICD-10-CM | POA: Diagnosis present

## 2023-09-26 DIAGNOSIS — F419 Anxiety disorder, unspecified: Secondary | ICD-10-CM | POA: Diagnosis present

## 2023-09-26 DIAGNOSIS — N12 Tubulo-interstitial nephritis, not specified as acute or chronic: Secondary | ICD-10-CM

## 2023-09-26 DIAGNOSIS — J9811 Atelectasis: Secondary | ICD-10-CM | POA: Diagnosis present

## 2023-09-26 DIAGNOSIS — E872 Acidosis, unspecified: Secondary | ICD-10-CM | POA: Diagnosis present

## 2023-09-26 DIAGNOSIS — G8929 Other chronic pain: Secondary | ICD-10-CM | POA: Diagnosis present

## 2023-09-26 DIAGNOSIS — E861 Hypovolemia: Secondary | ICD-10-CM | POA: Diagnosis not present

## 2023-09-26 LAB — COMPREHENSIVE METABOLIC PANEL WITH GFR
ALT: 11 U/L (ref 0–44)
AST: 16 U/L (ref 15–41)
Albumin: 2.7 g/dL — ABNORMAL LOW (ref 3.5–5.0)
Alkaline Phosphatase: 88 U/L (ref 38–126)
Anion gap: 16 — ABNORMAL HIGH (ref 5–15)
BUN: 32 mg/dL — ABNORMAL HIGH (ref 6–20)
CO2: 22 mmol/L (ref 22–32)
Calcium: 8.7 mg/dL — ABNORMAL LOW (ref 8.9–10.3)
Chloride: 93 mmol/L — ABNORMAL LOW (ref 98–111)
Creatinine, Ser: 2.22 mg/dL — ABNORMAL HIGH (ref 0.44–1.00)
GFR, Estimated: 28 mL/min — ABNORMAL LOW (ref >60)
Glucose, Bld: 176 mg/dL — ABNORMAL HIGH (ref 70–99)
Potassium: 4.2 mmol/L (ref 3.5–5.1)
Sodium: 131 mmol/L — ABNORMAL LOW (ref 135–145)
Total Bilirubin: 1 mg/dL (ref 0.0–1.2)
Total Protein: 7.9 g/dL (ref 6.5–8.1)

## 2023-09-26 LAB — CBC WITH DIFFERENTIAL/PLATELET
Abs Immature Granulocytes: 0.17 K/uL — ABNORMAL HIGH (ref 0.00–0.07)
Basophils Absolute: 0.1 K/uL (ref 0.0–0.1)
Basophils Relative: 0 %
Eosinophils Absolute: 0 K/uL (ref 0.0–0.5)
Eosinophils Relative: 0 %
HCT: 36.8 % (ref 36.0–46.0)
Hemoglobin: 11.9 g/dL — ABNORMAL LOW (ref 12.0–15.0)
Immature Granulocytes: 1 %
Lymphocytes Relative: 7 %
Lymphs Abs: 0.9 K/uL (ref 0.7–4.0)
MCH: 28.5 pg (ref 26.0–34.0)
MCHC: 32.3 g/dL (ref 30.0–36.0)
MCV: 88.2 fL (ref 80.0–100.0)
Monocytes Absolute: 0.4 K/uL (ref 0.1–1.0)
Monocytes Relative: 3 %
Neutro Abs: 12.5 K/uL — ABNORMAL HIGH (ref 1.7–7.7)
Neutrophils Relative %: 89 %
Platelets: 633 K/uL — ABNORMAL HIGH (ref 150–400)
RBC: 4.17 MIL/uL (ref 3.87–5.11)
RDW: 13 % (ref 11.5–15.5)
WBC: 14.3 K/uL — ABNORMAL HIGH (ref 4.0–10.5)
nRBC: 0 % (ref 0.0–0.2)

## 2023-09-26 LAB — URINALYSIS, W/ REFLEX TO CULTURE (INFECTION SUSPECTED)
Glucose, UA: NEGATIVE mg/dL
Ketones, ur: NEGATIVE mg/dL
Nitrite: NEGATIVE
Protein, ur: 100 mg/dL — AB
Specific Gravity, Urine: 1.025 (ref 1.005–1.030)
pH: 5 (ref 5.0–8.0)

## 2023-09-26 LAB — LACTIC ACID, PLASMA: Lactic Acid, Venous: 2.7 mmol/L (ref 0.5–1.9)

## 2023-09-26 LAB — RESP PANEL BY RT-PCR (RSV, FLU A&B, COVID)  RVPGX2
Influenza A by PCR: NEGATIVE
Influenza B by PCR: NEGATIVE
Resp Syncytial Virus by PCR: NEGATIVE
SARS Coronavirus 2 by RT PCR: NEGATIVE

## 2023-09-26 LAB — PROTIME-INR
INR: 1.3 — ABNORMAL HIGH (ref 0.8–1.2)
Prothrombin Time: 16.7 s — ABNORMAL HIGH (ref 11.4–15.2)

## 2023-09-26 LAB — PREGNANCY, URINE: Preg Test, Ur: NEGATIVE

## 2023-09-26 MED ORDER — LACTATED RINGERS IV BOLUS (SEPSIS)
1000.0000 mL | Freq: Once | INTRAVENOUS | Status: AC
  Administered 2023-09-26: 1000 mL via INTRAVENOUS

## 2023-09-26 MED ORDER — ONDANSETRON HCL 4 MG/2ML IJ SOLN
4.0000 mg | Freq: Once | INTRAMUSCULAR | Status: AC
  Administered 2023-09-26: 4 mg via INTRAVENOUS
  Filled 2023-09-26: qty 2

## 2023-09-26 MED ORDER — SODIUM CHLORIDE 0.9 % IV SOLN
2.0000 g | Freq: Once | INTRAVENOUS | Status: AC
  Administered 2023-09-26: 2 g via INTRAVENOUS
  Filled 2023-09-26: qty 20

## 2023-09-26 MED ORDER — LACTATED RINGERS IV SOLN: INTRAVENOUS | Status: DC

## 2023-09-26 NOTE — ED Provider Notes (Signed)
 [facility] AT [facility] Provider Note   CSN: [id number] Arrival date & time: 09/26/23  2034     Patient presents with: Abdominal Pain and Back Pain   Catherine Combs is a 43 y.o. female.    Abdominal Pain Back Pain Associated symptoms: abdominal pain    This patient is a 43 year old female, she has a history of substance abuse with a prior history of opiate abuse, she has a history of being on Subutex  over the last couple of months, she is chronically constipated and states that she can sometimes go weeks without using the bathroom but also reports that she had been diagnosed with a urinary tract infection at her family doctor's office in [place] about 3 weeks ago at which time she was prescribed Bactrim.  She reports that she feels like she got a little bit better, but over the last couple of weeks has had some progressive symptoms including lower abdominal pain, back pain, some urinary abnormalities including small volume frequency and abnormal appearing urine which is cloudy and dark.  She denies fevers or chills, she denies nausea or vomiting, she has felt weaker today.  I have reviewed the medical record and there does not appear to be any of these records from the last month, she does have a history of being followed by substance abuse clinics in the past    Prior to Admission medications   Medication Sig Start Date End Date Taking? Authorizing Provider  ALPRAZolam  (XANAX ) 1 MG tablet Take 1 mg by mouth 3 (three) times daily.    [provider]  amphetamine-dextroamphetamine (ADDERALL) 20 MG tablet Take 20 mg by mouth 2 (two) times daily.    [provider]  HYDROmorphone (DILAUDID) 8 MG tablet Take 8 mg by mouth every 8 (eight) hours as needed for severe pain.    [provider]  methadone (DOLOPHINE) 10 MG tablet Take 10 mg by mouth every 8 (eight) hours.    [provider]    Allergies: Hydrocodone     Review  of Systems  Gastrointestinal:  Positive for abdominal pain.  Musculoskeletal:  Positive for back pain.  All other systems reviewed and are negative.   Updated Vital Signs BP 93/71   Pulse (!) 144   Temp 97.9 F (36.6 C)   Resp (!) 21   SpO2 96%   Physical Exam Vitals and nursing note reviewed.  Constitutional:      Appearance: She is well-developed. She is ill-appearing.  HENT:     Head: Normocephalic and atraumatic.     Mouth/Throat:     Pharynx: No oropharyngeal exudate.  Eyes:     General: No scleral icterus.       Right eye: No discharge.        Left eye: No discharge.     Conjunctiva/sclera: Conjunctivae normal.     Pupils: Pupils are equal, round, and reactive to light.  Neck:     Thyroid: No thyromegaly.     Vascular: No JVD.  Cardiovascular:     Rate and Rhythm: Regular rhythm. Tachycardia present.     Heart sounds: Normal heart sounds. No murmur heard.    No friction rub. No gallop.     Comments: Tachycardic to 140 bpm with thready radial artery pulses Pulmonary:     Effort: Pulmonary effort is normal. No respiratory distress.     Breath sounds: Normal breath sounds. No wheezing or rales.  Abdominal:     General: Bowel  sounds are normal. There is no distension.     Palpations: Abdomen is soft. There is no mass.     Tenderness: There is abdominal tenderness.     Comments: Diffuse abdominal tenderness to palpation but more in the right and left lower quadrants  Musculoskeletal:        General: No tenderness. Normal range of motion.     Cervical back: Normal range of motion and neck supple.     Right lower leg: No edema.     Left lower leg: No edema.  Lymphadenopathy:     Cervical: No cervical adenopathy.  Skin:    General: Skin is warm and dry.     Findings: No erythema or rash.  Neurological:     General: No focal deficit present.     Mental Status: She is alert.     Coordination: Coordination normal.  Psychiatric:        Behavior: Behavior normal.      (all labs ordered are listed, but only abnormal results are displayed) Labs Reviewed  LACTIC ACID, PLASMA - Abnormal; Notable for the following components:      Result Value   Lactic Acid, Venous 2.7 (*)    All other components within normal limits  COMPREHENSIVE METABOLIC PANEL WITH GFR - Abnormal; Notable for the following components:   Sodium 131 (*)    Chloride 93 (*)    Glucose, Bld 176 (*)    BUN 32 (*)    Creatinine, Ser 2.22 (*)    Calcium 8.7 (*)    Albumin 2.7 (*)    GFR, Estimated 28 (*)    Anion gap 16 (*)    All other components within normal limits  CBC WITH DIFFERENTIAL/PLATELET - Abnormal; Notable for the following components:   WBC 14.3 (*)    Hemoglobin 11.9 (*)    Platelets 633 (*)    Neutro Abs 12.5 (*)    Abs Immature Granulocytes 0.17 (*)    All other components within normal limits  PROTIME-INR - Abnormal; Notable for the following components:   Prothrombin Time 16.7 (*)    INR 1.3 (*)    All other components within normal limits  URINALYSIS, W/ REFLEX TO CULTURE (INFECTION SUSPECTED) - Abnormal; Notable for the following components:   Color, Urine AMBER (*)    APPearance CLOUDY (*)    Hgb urine dipstick SMALL (*)    Bilirubin Urine SMALL (*)    Protein, ur 100 (*)    Leukocytes,Ua MODERATE (*)    Bacteria, UA MANY (*)    All other components within normal limits  RESP PANEL BY RT-PCR (RSV, FLU A&B, COVID)  RVPGX2  CULTURE, BLOOD (ROUTINE X 2)  CULTURE, BLOOD (ROUTINE X 2)  PREGNANCY, URINE  LACTIC ACID, PLASMA  POC URINE PREG, ED    EKG: EKG Interpretation Date/Time:  Sunday September 26 2023 21:50:03 EDT Ventricular Rate:  127 PR Interval:  128 QRS Duration:  77 QT Interval:  293 QTC Calculation: 426 R Axis:   61  Text Interpretation: Sinus tachycardia Confirmed by Cleotilde Rogue ([id number]) on 09/26/2023 11:09:24 PM  Radiology: CT ABDOMEN PELVIS WO CONTRAST Result Date: 09/26/2023 CLINICAL DATA:  Left lower quadrant abdominal  pain. EXAM: CT ABDOMEN AND PELVIS WITHOUT CONTRAST TECHNIQUE: Multidetector CT imaging of the abdomen and pelvis was performed following the standard protocol without IV contrast. RADIATION DOSE REDUCTION: This exam was performed according to the departmental dose-optimization program which includes automated exposure control, adjustment of the mA  and/or kV according to patient size and/or use of iterative reconstruction technique. COMPARISON:  None Available. FINDINGS: Lower chest: There is minimal atelectasis in the lung bases. Hepatobiliary: The liver is enlarged. The gallbladder is dilated. Gallstones are likely present. There is mild gallbladder wall edema. There is no biliary ductal dilatation. Pancreas: Unremarkable. No pancreatic ductal dilatation or surrounding inflammatory changes. Spleen: Normal in size without focal abnormality. Adrenals/Urinary Tract: There is a cyst in the right kidney measuring 13 mm. Otherwise, the adrenal glands and kidneys are within normal limits. There is a small amount of air in the bladder. Stomach/Bowel: The stomach is moderately distended. There are multiple dilated small bowel loops measuring up to 4.4 cm in the central and upper abdomen. Transition point is seen centrally in the mid abdomen. Distal small bowel loops are decompressed. Colon is normal in size. Appendix appears normal. There is a large amount of stool throughout the colon. There is no free intraperitoneal air or pneumatosis. Vascular/Lymphatic: No significant vascular findings are present. No enlarged abdominal or pelvic lymph nodes. Reproductive: IUD is present, questionably low-lying. Uterus is otherwise within normal limits. The ovaries are not well defined secondary to free fluid in the pelvis. There is questionable enlarged right ovary. Other: There is a moderate amount of ascites and interloop fluid throughout the abdomen and pelvis. There is edema throughout the omentum. There is a small fat containing  umbilical hernia. Musculoskeletal: No acute or significant osseous findings. IMPRESSION: 1. Findings compatible with small bowel obstruction with transition point in the mid abdomen. 2. Moderate amount of ascites and interloop fluid throughout the abdomen and pelvis. 3. Edema throughout the omentum. 4. Questionable enlarged right ovary. Recommend further evaluation with pelvic ultrasound. 5. IUD is present, questionably low-lying. 6. Hepatomegaly. 7. Dilated gallbladder with probable gallstones and mild gallbladder wall edema. Correlate clinically for cholecystitis. 8. Small amount of air in the bladder. Electronically Signed   By: Greig Pique M.D.   On: 09/26/2023 23:06   DG Chest Port 1 View Result Date: 09/26/2023 CLINICAL DATA:  Questionable sepsis - evaluate for abnormality EXAM: PORTABLE CHEST 1 VIEW COMPARISON:  None available FINDINGS: Low lung volumes, bibasilar atelectasis. No effusions. Heart and mediastinal contours within normal limits. IMPRESSION: Low lung volumes, bibasilar atelectasis. Electronically Signed   By: Franky Crease M.D.   On: 09/26/2023 22:18     .Critical Care  Performed by: Cleotilde Rogue, MD Authorized by: Cleotilde Rogue, MD   Critical care provider statement:    Critical care time (minutes):  45   Critical care time was exclusive of:  Separately billable procedures and treating other patients and teaching time   Critical care was necessary to treat or prevent imminent or life-threatening deterioration of the following conditions:  Sepsis   Critical care was time spent personally by me on the following activities:  Development of treatment plan with patient or surrogate, discussions with consultants, evaluation of patient's response to treatment, examination of patient, obtaining history from patient or surrogate, review of old charts, re-evaluation of patient's condition, pulse oximetry, ordering and review of radiographic studies, ordering and review of laboratory  studies and ordering and performing treatments and interventions   I assumed direction of critical care for this patient from another provider in my specialty: no     Care discussed with: admitting provider   Comments:          Medications Ordered in the ED  lactated ringers  infusion ( Intravenous New Bag/Given 09/26/23 2142)  lactated  ringers  bolus 1,000 mL (1,000 mLs Intravenous New Bag/Given 09/26/23 2136)    And  lactated ringers  bolus 1,000 mL (1,000 mLs Intravenous New Bag/Given 09/26/23 2142)    And  lactated ringers  bolus 1,000 mL (has no administration in time range)  cefTRIAXone  (ROCEPHIN ) 2 g in sodium chloride  0.9 % 100 mL IVPB (0 g Intravenous Stopped 09/26/23 2209)  ondansetron  (ZOFRAN ) injection 4 mg (4 mg Intravenous Given 09/26/23 2139)    Clinical Course as of 09/26/23 2321  Sun Sep 26, 2023  2223 Mucus: PRESENT [BM]  2223 WBC, UA: 6-10 [BM]    Clinical Course User Index [BM] Cleotilde Rogue, MD                                 Medical Decision Making Amount and/or Complexity of Data Reviewed Labs: ordered. Decision-making details documented in ED Course. Radiology: ordered.  Risk Prescription drug management. Decision regarding hospitalization.   The patient is generally weak, she is very tachycardic and has a blood pressure of 95 systolic in the room.  Weak pulses, history of recent UTI and I am concerned that she has pyelonephritis and likely sepsis.  She also has diffuse abdominal tenderness raising the suspicion for possible intra-abdominal pathology such as ruptured appendicitis, diverticulitis or perforated ulcer.  She will need labs and a workup, she is agreeable.  Code sepsis activated   This patient presents to the ED for concern of abdominal pain, dysuria, weakness, this involves an extensive number of treatment options, and is a complaint that carries with it a high risk of complications and morbidity.  The differential diagnosis includes as  above   Co morbidities / Chronic conditions that complicate the patient evaluation  Obese, substance abuse   Additional history obtained:  Additional history obtained from EMR External records from outside source obtained and reviewed including medical record, prior treatment for urine infections   Lab Tests:  I Ordered, and personally interpreted labs.  The pertinent results include: Acute kidney injury, leukocytosis, elevated lactic acid, urinalysis consistent with likely UTI   Imaging Studies ordered:  I ordered imaging studies including x-ray unremarkable, CT scan of the abdomen and pelvis I independently visualized and interpreted imaging which showed acute small bowel obstruction, possibly some inflammation around the gallbladder, the patient is not focally tender in the right upper quadrant on my exam I agree with the radiologist interpretation   Cardiac Monitoring: / EKG:  The patient was maintained on a cardiac monitor.  I personally viewed and interpreted the cardiac monitored which showed an underlying rhythm of: Sinus tachycardia rate of about 135   Problem List / ED Course / Critical interventions / Medication management  This patient is critically ill with what appears to be sepsis from pyelonephritis also has a bowel obstruction, NG tube ordered, antibiotics and IV fluids at 30 cc/kg of lactated Ringer 's given I ordered medication including as above Reevaluation of the patient after these medicines showed that the patient proved I have reviewed the patients home medicines and have made adjustments as needed   Consultations Obtained:  I requested consultation with the hospitalist Dr. Manfred,  and discussed lab and imaging findings as well as pertinent plan - they recommend: Admission to the stepdown unit   Social Determinants of Health:  Substance abuse history   Test / Admission - Considered:  Admit to high level of care, critically ill  Final diagnoses:  SBO (small bowel obstruction) (HCC)  Sepsis, due to unspecified organism, unspecified whether acute organ dysfunction present [facility])  Pyelonephritis  AKI (acute kidney injury) [facility])    ED Discharge Orders     None          Cleotilde Rogue, MD 09/26/23 2321

## 2023-09-26 NOTE — ED Triage Notes (Signed)
 Pt from home, complains of lower abdominal pain, back pain, and unresolved UTI symptoms (finished antibiotic course). Pt appears jaundice and per family member she look discolored. Denies CP and SOB. Endorses constipation, fatigue and intermittent sweating.

## 2023-09-26 NOTE — Progress Notes (Signed)
 Elink monitoring for the code sepsis protocol.

## 2023-09-26 NOTE — H&P (Signed)
 History and Physical    Patient: Catherine Combs FMW:[id number] DOB: [birth year] DOA: 09/26/2023 DOS: the patient was seen and examined on 09/27/2023 PCP: Toribio Jerel MATSU, MD  Patient coming from: Home  Chief Complaint:  Chief Complaint  Patient presents with   Abdominal Pain   Back Pain   HPI: Catherine Combs is a 43 y.o. female with medical history significant of prior history of opiate abuse, chronic constipation who presents to the emergency department due to about 1 week onset of lower abdominal and back pain, she complained of increased urinary frequency with production of only small amount of urine and noted the urine being cloudier and dark.  Patient states that she was recently treated for UTI by her PCP in [place] about 3 weeks ago and was prescribed Bactrim.  Symptoms improved temporarily, but this worsened in the last week.  She denies chest pain, shortness of breath, nausea, vomiting, fever or chills.  ED Course:  In the emergency department, she was tachycardic, BP was 100/74, other vital signs were within normal range.  Workup in the ED showed WBC 14.3, hemoglobin 11.9, hematocrit 36.8, MCV 88.2, platelets 633.  BMP shows sodium 131, potassium 4.2, chloride 93, bicarb 22, blood glucose 176, BUN/creatinine 32/2.22.  Lactic acid 2.7 > 2.7.  Pregnancy test was negative, urinalysis was suspicious for UTI.  Influenza A, B, SARS, RSV, RSV was negative, blood culture pending. CT abdomen and pelvis without contrast showed small bowel obstruction with transition point in the mid abdomen moderate amount of ascites and interloop fluid throughout the abdomen and pelvis. Chest x-ray showed low lung volumes, bibasilar atelectasis. Patient was treated with IV ceftriaxone  and Zofran .  IV hydration per sepsis protocol was provided.  [facility] was asked to admit patient.  Review of Systems: Review of systems as noted in the HPI. All other systems reviewed and are negative.   Past Medical History:   Diagnosis Date   DJD (degenerative joint disease)    Past Surgical History:  Procedure Laterality Date   CERVICAL CONE BIOPSY     CESAREAN SECTION      Social History:  reports that she has been smoking cigarettes. She does not have any smokeless tobacco history on file. She reports that she does not drink alcohol and does not use drugs.   Allergies  Allergen Reactions   Hydrocodone  Itching and Nausea Only    History reviewed. No pertinent family history.    Prior to Admission medications   Medication Sig Start Date End Date Taking? Authorizing Provider  ALPRAZolam  (XANAX ) 1 MG tablet Take 1 mg by mouth 3 (three) times daily.    [provider]  amphetamine-dextroamphetamine (ADDERALL) 20 MG tablet Take 20 mg by mouth 2 (two) times daily.    [provider]  HYDROmorphone (DILAUDID) 8 MG tablet Take 8 mg by mouth every 8 (eight) hours as needed for severe pain.    [provider]  methadone (DOLOPHINE) 10 MG tablet Take 10 mg by mouth every 8 (eight) hours.    [provider]    Physical Exam: BP 98/79   Pulse (!) 112   Temp 97.9 F (36.6 C) (Oral)   Resp 19   SpO2 92%   General: 43 y.o. year-old female ill-appearing, chills, but in no acute distress.  Alert and oriented x3. HEENT: NCAT, EOMI, dry mucous membrane Neck: Supple, trachea medial Cardiovascular: Tachycardia.  Regular rate and rhythm with no rubs or gallops.  No thyromegaly or JVD noted.  No  lower extremity edema. 2/4 pulses in all 4 extremities. Respiratory: Clear to auscultation with no wheezes or rales. Good inspiratory effort. Abdomen: Soft, tender to palpation of lower quadrants without guarding.  Nondistended with normal bowel sounds x4 quadrants. Muskuloskeletal: No cyanosis, clubbing or edema noted bilaterally Neuro: CN II-XII intact, strength 5/5 x 4, sensation, reflexes intact Skin: No ulcerative lesions noted or rashes Psychiatry: Judgement and insight appear  normal. Mood is appropriate for condition and setting          Labs on Admission:  Basic Metabolic Panel: Recent Labs  Lab 09/26/23 2128  NA 131*  K 4.2  CL 93*  CO2 22  GLUCOSE 176*  BUN 32*  CREATININE 2.22*  CALCIUM 8.7*   Liver Function Tests: Recent Labs  Lab 09/26/23 2128  AST 16  ALT 11  ALKPHOS 88  BILITOT 1.0  PROT 7.9  ALBUMIN 2.7*   No results for input(s): LIPASE, AMYLASE in the last 168 hours. No results for input(s): AMMONIA in the last 168 hours. CBC: Recent Labs  Lab 09/26/23 2128 09/27/23 0351  WBC 14.3* 10.4  NEUTROABS 12.5*  --   HGB 11.9* 10.0*  HCT 36.8 30.4*  MCV 88.2 88.4  PLT 633* 460*   Cardiac Enzymes: No results for input(s): CKTOTAL, CKMB, CKMBINDEX, TROPONINI in the last 168 hours.  BNP (last 3 results) No results for input(s): BNP in the last 8760 hours.  ProBNP (last 3 results) No results for input(s): PROBNP in the last 8760 hours.  CBG: No results for input(s): GLUCAP in the last 168 hours.  Radiological Exams on Admission: DG Chest Portable 1 View Result Date: 09/27/2023 CLINICAL DATA:  NGT placement film. EXAM: PORTABLE CHEST 1 VIEW COMPARISON:  Portable chest yesterday at 9:56 p.m. FINDINGS: 2:06 a.m. NGT has been inserted, adequately intragastric with the tip in the proximal fundal area. The cardiac size is normal. The lungs are expiratory with bibasilar atelectasis without evidence of infiltrates or effusion. The mediastinum is normally outlined. Thoracic cage intact. Overlying telemetry leads. IMPRESSION: 1. NGT adequately intragastric with the tip in the proximal fundal area. 2. Expiratory chest with bibasilar atelectasis. Electronically Signed   By: Francis Quam M.D.   On: 09/27/2023 02:23   CT ABDOMEN PELVIS WO CONTRAST Result Date: 09/26/2023 CLINICAL DATA:  Left lower quadrant abdominal pain. EXAM: CT ABDOMEN AND PELVIS WITHOUT CONTRAST TECHNIQUE: Multidetector CT imaging of the abdomen and  pelvis was performed following the standard protocol without IV contrast. RADIATION DOSE REDUCTION: This exam was performed according to the departmental dose-optimization program which includes automated exposure control, adjustment of the mA and/or kV according to patient size and/or use of iterative reconstruction technique. COMPARISON:  None Available. FINDINGS: Lower chest: There is minimal atelectasis in the lung bases. Hepatobiliary: The liver is enlarged. The gallbladder is dilated. Gallstones are likely present. There is mild gallbladder wall edema. There is no biliary ductal dilatation. Pancreas: Unremarkable. No pancreatic ductal dilatation or surrounding inflammatory changes. Spleen: Normal in size without focal abnormality. Adrenals/Urinary Tract: There is a cyst in the right kidney measuring 13 mm. Otherwise, the adrenal glands and kidneys are within normal limits. There is a small amount of air in the bladder. Stomach/Bowel: The stomach is moderately distended. There are multiple dilated small bowel loops measuring up to 4.4 cm in the central and upper abdomen. Transition point is seen centrally in the mid abdomen. Distal small bowel loops are decompressed. Colon is normal in size. Appendix appears normal. There is a  large amount of stool throughout the colon. There is no free intraperitoneal air or pneumatosis. Vascular/Lymphatic: No significant vascular findings are present. No enlarged abdominal or pelvic lymph nodes. Reproductive: IUD is present, questionably low-lying. Uterus is otherwise within normal limits. The ovaries are not well defined secondary to free fluid in the pelvis. There is questionable enlarged right ovary. Other: There is a moderate amount of ascites and interloop fluid throughout the abdomen and pelvis. There is edema throughout the omentum. There is a small fat containing umbilical hernia. Musculoskeletal: No acute or significant osseous findings. IMPRESSION: 1. Findings  compatible with small bowel obstruction with transition point in the mid abdomen. 2. Moderate amount of ascites and interloop fluid throughout the abdomen and pelvis. 3. Edema throughout the omentum. 4. Questionable enlarged right ovary. Recommend further evaluation with pelvic ultrasound. 5. IUD is present, questionably low-lying. 6. Hepatomegaly. 7. Dilated gallbladder with probable gallstones and mild gallbladder wall edema. Correlate clinically for cholecystitis. 8. Small amount of air in the bladder. Electronically Signed   By: Greig Pique M.D.   On: 09/26/2023 23:06   DG Chest Port 1 View Result Date: 09/26/2023 CLINICAL DATA:  Questionable sepsis - evaluate for abnormality EXAM: PORTABLE CHEST 1 VIEW COMPARISON:  None available FINDINGS: Low lung volumes, bibasilar atelectasis. No effusions. Heart and mediastinal contours within normal limits. IMPRESSION: Low lung volumes, bibasilar atelectasis. Electronically Signed   By: Franky Crease M.D.   On: 09/26/2023 22:18    EKG: I independently viewed the EKG done and my findings are as followed: Sinus tachycardia at a rate of 127 bpm  Assessment/Plan Present on Admission:  Sepsis secondary to UTI [facility])  Principal Problem:   SBO (small bowel obstruction) (HCC) Active Problems:   Sepsis secondary to UTI (HCC)   Lactic acidosis   Thrombocytosis   Elevated serum creatinine   Hyponatremia  Small bowel obstruction Continue NG tube  Continue NPO at this time with plan to advanced diet as tolerated Continue IV hydration Continue IV morphine  2 mg q.4h p.r.n. for moderate/severe pain Continue Zofran  p.r.n. for nausea/vomiting General surgery will be consulted and we will await further recommendations  Sepsis secondary to UTI POA Patient was tachycardic and have leukocytosis (met SIRS criteria), source of infection is UTI. She was recently treated with antibiotics due to UTI Patient was treated with IV ceftriaxone , which hepatolysin by this  time Urine and blood culture culture pending  Lactic acidosis possibly due to sepsis Lactic acid 2.7 > 2.7  Thrombocytosis possibly reactive Platelets 633, continue to monitor platelet levels  Elevated creatinine (?  Acute kidney injury vs CKD) BUN/creatinine 32/2.22; no prior labs for comparison, so unknown if this is an acute or chronic  Hyponatremia Sodium 131, this is possibly due to dehydration Continue IV hydration and continue to monitor BMP  DVT prophylaxis: Heparin  subcu  Code Status: Full code  Family Communication: None at bedside  Consults: General Surgery  Severity of Illness: The appropriate patient status for this patient is INPATIENT. Inpatient status is judged to be reasonable and necessary in order to provide the required intensity of service to ensure the patient's safety. The patient's presenting symptoms, physical exam findings, and initial radiographic and laboratory data in the context of their chronic comorbidities is felt to place them at high risk for further clinical deterioration. Furthermore, it is not anticipated that the patient will be medically stable for discharge from the hospital within 2 midnights of admission.   * I certify that at the  point of admission it is my clinical judgment that the patient will require inpatient hospital care spanning beyond 2 midnights from the point of admission due to high intensity of service, high risk for further deterioration and high frequency of surveillance required.*  Author: Bunny Kleist, DO 09/27/2023 4:46 AM  For on call review [email or web address].

## 2023-09-26 NOTE — Consult Note (Signed)
 CODE SEPSIS - PHARMACY COMMUNICATION  **Broad Spectrum Antibiotics should be administered within 1 hour of Sepsis diagnosis**  Time Code Sepsis Called/Page Received: 2121  Antibiotics Ordered: Rocephin  2G x1  Time of 1st antibiotic administration: 2139  Additional action taken by pharmacy: none   Annabella LOISE Banks ,PharmD Clinical Pharmacist  09/26/2023  9:47 PM

## 2023-09-27 ENCOUNTER — Inpatient Hospital Stay (HOSPITAL_COMMUNITY): Payer: Medicaid - Out of State

## 2023-09-27 DIAGNOSIS — E872 Acidosis, unspecified: Secondary | ICD-10-CM | POA: Insufficient documentation

## 2023-09-27 DIAGNOSIS — R6510 Systemic inflammatory response syndrome (SIRS) of non-infectious origin without acute organ dysfunction: Secondary | ICD-10-CM | POA: Insufficient documentation

## 2023-09-27 DIAGNOSIS — K56609 Unspecified intestinal obstruction, unspecified as to partial versus complete obstruction: Secondary | ICD-10-CM | POA: Diagnosis not present

## 2023-09-27 DIAGNOSIS — N179 Acute kidney failure, unspecified: Secondary | ICD-10-CM | POA: Diagnosis not present

## 2023-09-27 DIAGNOSIS — D75839 Thrombocytosis, unspecified: Secondary | ICD-10-CM | POA: Insufficient documentation

## 2023-09-27 DIAGNOSIS — R7989 Other specified abnormal findings of blood chemistry: Secondary | ICD-10-CM | POA: Insufficient documentation

## 2023-09-27 DIAGNOSIS — E871 Hypo-osmolality and hyponatremia: Secondary | ICD-10-CM | POA: Insufficient documentation

## 2023-09-27 LAB — COMPREHENSIVE METABOLIC PANEL WITH GFR
ALT: 9 U/L (ref 0–44)
AST: 11 U/L — ABNORMAL LOW (ref 15–41)
Albumin: 2.2 g/dL — ABNORMAL LOW (ref 3.5–5.0)
Alkaline Phosphatase: 74 U/L (ref 38–126)
Anion gap: 9 (ref 5–15)
BUN: 25 mg/dL — ABNORMAL HIGH (ref 6–20)
CO2: 27 mmol/L (ref 22–32)
Calcium: 8.2 mg/dL — ABNORMAL LOW (ref 8.9–10.3)
Chloride: 99 mmol/L (ref 98–111)
Creatinine, Ser: 1.27 mg/dL — ABNORMAL HIGH (ref 0.44–1.00)
GFR, Estimated: 55 mL/min — ABNORMAL LOW (ref >60)
Glucose, Bld: 117 mg/dL — ABNORMAL HIGH (ref 70–99)
Potassium: 3.9 mmol/L (ref 3.5–5.1)
Sodium: 135 mmol/L (ref 135–145)
Total Bilirubin: 0.7 mg/dL (ref 0.0–1.2)
Total Protein: 6.6 g/dL (ref 6.5–8.1)

## 2023-09-27 LAB — RAPID URINE DRUG SCREEN, HOSP PERFORMED
Amphetamines: NOT DETECTED
Barbiturates: NOT DETECTED
Benzodiazepines: POSITIVE — AB
Cocaine: NOT DETECTED
Opiates: NOT DETECTED
Tetrahydrocannabinol: NOT DETECTED

## 2023-09-27 LAB — URINALYSIS, COMPLETE (UACMP) WITH MICROSCOPIC
Bilirubin Urine: NEGATIVE
Glucose, UA: NEGATIVE mg/dL
Ketones, ur: NEGATIVE mg/dL
Nitrite: NEGATIVE
Protein, ur: 100 mg/dL — AB
Specific Gravity, Urine: 1.03 (ref 1.005–1.030)
WBC, UA: >50 WBC/hpf (ref 0–5)
pH: 6 (ref 5.0–8.0)

## 2023-09-27 LAB — LACTIC ACID, PLASMA
Lactic Acid, Venous: 1.1 mmol/L (ref 0.5–1.9)
Lactic Acid, Venous: 1.2 mmol/L (ref 0.5–1.9)
Lactic Acid, Venous: 2.7 mmol/L (ref 0.5–1.9)

## 2023-09-27 LAB — CBC
HCT: 30.4 % — ABNORMAL LOW (ref 36.0–46.0)
Hemoglobin: 10 g/dL — ABNORMAL LOW (ref 12.0–15.0)
MCH: 29.1 pg (ref 26.0–34.0)
MCHC: 32.9 g/dL (ref 30.0–36.0)
MCV: 88.4 fL (ref 80.0–100.0)
Platelets: 460 K/uL — ABNORMAL HIGH (ref 150–400)
RBC: 3.44 MIL/uL — ABNORMAL LOW (ref 3.87–5.11)
RDW: 13 % (ref 11.5–15.5)
WBC: 10.4 K/uL (ref 4.0–10.5)
nRBC: 0 % (ref 0.0–0.2)

## 2023-09-27 LAB — MRSA NEXT GEN BY PCR, NASAL: MRSA by PCR Next Gen: NOT DETECTED

## 2023-09-27 LAB — MAGNESIUM: Magnesium: 1.6 mg/dL — ABNORMAL LOW (ref 1.7–2.4)

## 2023-09-27 LAB — HIV ANTIBODY (ROUTINE TESTING W REFLEX): HIV Screen 4th Generation wRfx: NONREACTIVE

## 2023-09-27 MED ORDER — LACTATED RINGERS IV BOLUS
1000.0000 mL | Freq: Once | INTRAVENOUS | Status: AC
  Administered 2023-09-27: 1000 mL via INTRAVENOUS

## 2023-09-27 MED ORDER — KETOROLAC TROMETHAMINE 15 MG/ML IJ SOLN
15.0000 mg | Freq: Four times a day (QID) | INTRAMUSCULAR | Status: AC
Start: 2023-09-27 — End: 2023-09-29
  Administered 2023-09-27 – 2023-09-29 (×9): 15 mg via INTRAVENOUS
  Filled 2023-09-27 (×10): qty 1

## 2023-09-27 MED ORDER — HEPARIN SODIUM (PORCINE) 5000 UNIT/ML IJ SOLN
5000.0000 [IU] | Freq: Three times a day (TID) | INTRAMUSCULAR | Status: DC
  Administered 2023-09-27 – 2023-09-30 (×10): 5000 [IU] via SUBCUTANEOUS
  Filled 2023-09-27 (×10): qty 1

## 2023-09-27 MED ORDER — ACETAMINOPHEN 325 MG PO TABS
650.0000 mg | ORAL_TABLET | Freq: Four times a day (QID) | ORAL | Status: DC | PRN
  Administered 2023-09-27: 650 mg via ORAL
  Filled 2023-09-27: qty 2

## 2023-09-27 MED ORDER — SODIUM CHLORIDE 0.9 % IV BOLUS
1000.0000 mL | Freq: Once | INTRAVENOUS | Status: AC
  Administered 2023-09-27: 1000 mL via INTRAVENOUS

## 2023-09-27 MED ORDER — BUPRENORPHINE HCL 8 MG SL SUBL
8.0000 mg | SUBLINGUAL_TABLET | Freq: Every day | SUBLINGUAL | Status: DC
  Administered 2023-09-27 – 2023-09-30 (×4): 8 mg via SUBLINGUAL
  Filled 2023-09-27 (×4): qty 1

## 2023-09-27 MED ORDER — LORAZEPAM 2 MG/ML IJ SOLN
1.0000 mg | Freq: Four times a day (QID) | INTRAMUSCULAR | Status: DC | PRN
  Administered 2023-09-27 – 2023-09-28 (×2): 1 mg via INTRAVENOUS
  Filled 2023-09-27 (×2): qty 1

## 2023-09-27 MED ORDER — ACETAMINOPHEN 650 MG RE SUPP: 650.0000 mg | Freq: Four times a day (QID) | RECTAL | Status: DC | PRN

## 2023-09-27 MED ORDER — CHLORHEXIDINE GLUCONATE CLOTH 2 % EX PADS
6.0000 | MEDICATED_PAD | Freq: Every day | CUTANEOUS | Status: DC
  Administered 2023-09-27 – 2023-09-30 (×4): 6 via TOPICAL

## 2023-09-27 MED ORDER — LACTATED RINGERS IV SOLN: INTRAVENOUS | Status: AC

## 2023-09-27 MED ORDER — ONDANSETRON HCL 4 MG/2ML IJ SOLN: 4.0000 mg | Freq: Four times a day (QID) | INTRAMUSCULAR | Status: DC | PRN

## 2023-09-27 MED ORDER — ACETAMINOPHEN 500 MG PO TABS
1000.0000 mg | ORAL_TABLET | Freq: Three times a day (TID) | ORAL | Status: DC
  Administered 2023-09-27 – 2023-09-30 (×9): 1000 mg via ORAL
  Filled 2023-09-27 (×9): qty 2

## 2023-09-27 MED ORDER — DIATRIZOATE MEGLUMINE & SODIUM 66-10 % PO SOLN
90.0000 mL | Freq: Once | ORAL | Status: AC
  Administered 2023-09-27: 90 mL via NASOGASTRIC
  Filled 2023-09-27: qty 90

## 2023-09-27 MED ORDER — ACETAMINOPHEN 500 MG PO TABS: 1000.0000 mg | ORAL_TABLET | Freq: Three times a day (TID) | ORAL | Status: DC

## 2023-09-27 MED ORDER — MORPHINE SULFATE (PF) 2 MG/ML IV SOLN: 2.0000 mg | INTRAVENOUS | Status: DC | PRN

## 2023-09-27 MED ORDER — ONDANSETRON HCL 4 MG PO TABS: 4.0000 mg | ORAL_TABLET | Freq: Four times a day (QID) | ORAL | Status: DC | PRN

## 2023-09-27 MED ORDER — ORAL CARE MOUTH RINSE: 15.0000 mL | OROMUCOSAL | Status: DC | PRN

## 2023-09-27 NOTE — Consult Note (Addendum)
 I was present with the medical student for this service. I personally verified the history of present illness, performed the physical exam, and made the plan for this encounter. I have verified the medical student's documentation and made modifications where appropriately. I have personally documented in my own words a brief history, physical, and plan below.     Difficult to appreciate transition point on CT that I reviewed personally. Right sided pain and enlarged ovary on the right. She is tender bilateral for me on exam.   Chronic constipation.  SBFT ordered today Will see if this is a true obstruction or more of a adynamic motility issue from opioids.  May end up a need additional work up for the right ovary while here pending how things progress.   Manuelita Pander, MD [facility] [address] [place] [address], [place] [address] [phone] (office)    [facility] Consult  Reason for Consult: SBO Referring Physician: Posey Maier, DO  Chief Complaint   Abdominal Pain; Back Pain     HPI: Catherine Combs is a 43 y.o. female with a history of substance use disorder and constipation who presented to the emergency department because of a 3-week history of lower abdominal pain. General surgery was consulted for SBO on CT. She reports that her abdominal pain started shortly after being treated for a UTI. She states that her pain worsened approximately a week ago with the development of a pulling sensation and tenderness in the right lower quadrant. She states that the pain is worse with intercourse and denies any palliative factors. She denies any radiation of the pain and rates it a 5/10 when it occurs. She reports that the pain comes and goes. She endorses that her current pain level is much lower than her initial presentation at the ED. She also endorses a history of constipation with normal bowel movements approximately once a week. She  reports her last bowel movement was 1.5 weeks ago. She denies passing any flatus since her hospitalization. She states that she has been on buprenorphine  for her substance use disorder (started treatment one month ago) and has not been taking Miralax  as recommended. She endorses a sensation of needing to having a bowel movement and pass flatus this morning. She denies any associated fever, chills, sweating, nausea, or vomiting. She does endorses discharge upon urination, but denies any hematuria, pain, or urgency.    Past Medical History:  Diagnosis Date   DJD (degenerative joint disease)     Past Surgical History:  Procedure Laterality Date   CERVICAL CONE BIOPSY     CESAREAN SECTION      History reviewed. No pertinent family history.  Social History   Tobacco Use   Smoking status: Every Day    Current packs/day: 1.00    Types: Cigarettes  Substance Use Topics   Alcohol use: No   Drug use: No    Medications: I have reviewed the patient's current medications. Prior to Admission:  Medications Prior to Admission  Medication Sig Dispense Refill Last Dose/Taking   ALPRAZolam  (XANAX ) 1 MG tablet Take 1 mg by mouth 3 (three) times daily.      amphetamine-dextroamphetamine (ADDERALL) 20 MG tablet Take 20 mg by mouth 2 (two) times daily.      HYDROmorphone (DILAUDID) 8 MG tablet Take 8 mg by mouth every 8 (eight) hours as needed for severe pain.      methadone (DOLOPHINE) 10 MG tablet Take 10 mg by mouth every 8 (eight)  hours.      Scheduled:  heparin   5,000 Units Subcutaneous Q8H   Continuous:  lactated ringers  125 mL/hr at 09/27/23 0818   PRN:acetaminophen  **OR** acetaminophen , LORazepam , morphine  injection, ondansetron  **OR** ondansetron  (ZOFRAN ) IV  Allergies  Allergen Reactions   Hydrocodone  Itching and Nausea Only     ROS:  Pertinent items are noted in HPI.  Blood pressure 100/68, pulse (!) 119, temperature 98.2 F (36.8 C), temperature source Oral, resp. rate 17,  SpO2 92%. Physical Exam Vitals reviewed.  Constitutional:      General: She is not in acute distress.    Appearance: She is not ill-appearing or diaphoretic.  Cardiovascular:     Rate and Rhythm: Regular rhythm. Tachycardia present.     Pulses: Normal pulses.     Heart sounds: Normal heart sounds.  Pulmonary:     Effort: Pulmonary effort is normal.     Breath sounds: Decreased air movement present. Decreased breath sounds present.  Abdominal:     General: Bowel sounds are decreased. There is distension.     Tenderness: There is generalized abdominal tenderness and tenderness in the right lower quadrant. There is no guarding.  Neurological:     Mental Status: She is alert.     Results: Results for orders placed or performed during the hospital encounter of 09/26/23 (from the past 48 hours)  Resp panel by RT-PCR (RSV, Flu A&B, Covid) Anterior Nasal Swab     Status: None   Collection Time: 09/26/23  9:28 PM   Specimen: Anterior Nasal Swab  Result Value Ref Range   SARS Coronavirus 2 by RT PCR NEGATIVE NEGATIVE    Comment: (NOTE) SARS-CoV-2 target nucleic acids are NOT DETECTED.  The SARS-CoV-2 RNA is generally detectable in upper respiratory specimens during the acute phase of infection. The lowest concentration of SARS-CoV-2 viral copies this assay can detect is 138 copies/mL. A negative result does not preclude SARS-Cov-2 infection and should not be used as the sole basis for treatment or other patient management decisions. A negative result may occur with  improper specimen collection/handling, submission of specimen other than nasopharyngeal swab, presence of viral mutation(s) within the areas targeted by this assay, and inadequate number of viral copies(<138 copies/mL). A negative result must be combined with clinical observations, patient history, and epidemiological information. The expected result is Negative.  Fact Sheet for Patients:   BloggerCourse.com  Fact Sheet for Healthcare Providers:  SeriousBroker.it  This test is no t yet approved or cleared by the [place]  FDA and  has been authorized for detection and/or diagnosis of SARS-CoV-2 by FDA under an Emergency Use Authorization (EUA). This EUA will remain  in effect (meaning this test can be used) for the duration of the COVID-19 declaration under Section 564(b)(1) of the Act, 21 U.S.C.section 360bbb-3(b)(1), unless the authorization is terminated  or revoked sooner.       Influenza A by PCR NEGATIVE NEGATIVE   Influenza B by PCR NEGATIVE NEGATIVE    Comment: (NOTE) The Xpert Xpress SARS-CoV-2/FLU/RSV plus assay is intended as an aid in the diagnosis of influenza from Nasopharyngeal swab specimens and should not be used as a sole basis for treatment. Nasal washings and aspirates are unacceptable for Xpert Xpress SARS-CoV-2/FLU/RSV testing.  Fact Sheet for Patients: BloggerCourse.com  Fact Sheet for Healthcare Providers: SeriousBroker.it  This test is not yet approved or cleared by the [place]  FDA and has been authorized for detection and/or diagnosis of SARS-CoV-2 by FDA under an Emergency Use Authorization (  EUA). This EUA will remain in effect (meaning this test can be used) for the duration of the COVID-19 declaration under Section 564(b)(1) of the Act, 21 U.S.C. section 360bbb-3(b)(1), unless the authorization is terminated or revoked.     Resp Syncytial Virus by PCR NEGATIVE NEGATIVE    Comment: (NOTE) Fact Sheet for Patients: BloggerCourse.com  Fact Sheet for Healthcare Providers: SeriousBroker.it  This test is not yet approved or cleared by the [place]  FDA and has been authorized for detection and/or diagnosis of SARS-CoV-2 by FDA under an Emergency Use Authorization (EUA).  This EUA will remain in effect (meaning this test can be used) for the duration of the COVID-19 declaration under Section 564(b)(1) of the Act, 21 U.S.C. section 360bbb-3(b)(1), unless the authorization is terminated or revoked.  Performed at [facility], [address] [place], [place] [address]   Lactic acid, plasma     Status: Abnormal   Collection Time: 09/26/23  9:28 PM  Result Value Ref Range   Lactic Acid, Venous 2.7 (HH) 0.5 - 1.9 mmol/L    Comment: CRITICAL RESULT CALLED TO, READ BACK BY AND VERIFIED WITH BONIS,G ON 09/26/23 AT 2230 BY PURDIE,J Performed at [facility], [address], [place], [place] [address]   Comprehensive metabolic panel     Status: Abnormal   Collection Time: 09/26/23  9:28 PM  Result Value Ref Range   Sodium 131 (L) 135 - 145 mmol/L   Potassium 4.2 3.5 - 5.1 mmol/L   Chloride 93 (L) 98 - 111 mmol/L   CO2 22 22 - 32 mmol/L   Glucose, Bld 176 (H) 70 - 99 mg/dL    Comment: Glucose reference range applies only to samples taken after fasting for at least 8 hours.   BUN 32 (H) 6 - 20 mg/dL   Creatinine, Ser 7.77 (H) 0.44 - 1.00 mg/dL   Calcium 8.7 (L) 8.9 - 10.3 mg/dL   Total Protein 7.9 6.5 - 8.1 g/dL   Albumin 2.7 (L) 3.5 - 5.0 g/dL   AST 16 15 - 41 U/L   ALT 11 0 - 44 U/L   Alkaline Phosphatase 88 38 - 126 U/L   Total Bilirubin 1.0 0.0 - 1.2 mg/dL   GFR, Estimated 28 (L) >60 mL/min    Comment: (NOTE) Calculated using the CKD-EPI Creatinine Equation (2021)    Anion gap 16 (H) 5 - 15    Comment: Performed at [facility], [address], [place], [place] [address]  CBC with Differential     Status: Abnormal   Collection Time: 09/26/23  9:28 PM  Result Value Ref Range   WBC 14.3 (H) 4.0 - 10.5 K/uL   RBC 4.17 3.87 - 5.11 MIL/uL   Hemoglobin 11.9 (L) 12.0 - 15.0 g/dL   HCT 63.1 63.9 - 53.9 %   MCV 88.2 80.0 - 100.0 fL   MCH 28.5 26.0 - 34.0 pg   MCHC 32.3 30.0 - 36.0 g/dL   RDW 86.9 88.4 - 84.4 %   Platelets 633 (H) 150 - 400 K/uL   nRBC  0.0 0.0 - 0.2 %   Neutrophils Relative % 89 %   Neutro Abs 12.5 (H) 1.7 - 7.7 K/uL   Lymphocytes Relative 7 %   Lymphs Abs 0.9 0.7 - 4.0 K/uL   Monocytes Relative 3 %   Monocytes Absolute 0.4 0.1 - 1.0 K/uL   Eosinophils Relative 0 %   Eosinophils Absolute 0.0 0.0 - 0.5 K/uL   Basophils Relative 0 %  Basophils Absolute 0.1 0.0 - 0.1 K/uL   Immature Granulocytes 1 %   Abs Immature Granulocytes 0.17 (H) 0.00 - 0.07 K/uL    Comment: Performed at [facility], [address], [place], [place] [address]  Protime-INR     Status: Abnormal   Collection Time: 09/26/23  9:28 PM  Result Value Ref Range   Prothrombin Time 16.7 (H) 11.4 - 15.2 seconds   INR 1.3 (H) 0.8 - 1.2    Comment: (NOTE) INR goal varies based on device and disease states. Performed at [facility], [address] [place], [place] [address]   Blood Culture (routine x 2)     Status: None (Preliminary result)   Collection Time: 09/26/23  9:28 PM   Specimen: BLOOD LEFT HAND  Result Value Ref Range   Specimen Description BLOOD LEFT HAND    Special Requests      BOTTLES DRAWN AEROBIC AND ANAEROBIC Blood Culture adequate volume Performed at [facility], [address] [place], [place] [address]    Culture PENDING    Report Status PENDING   Blood Culture (routine x 2)     Status: None (Preliminary result)   Collection Time: 09/26/23  9:28 PM   Specimen: BLOOD RIGHT HAND  Result Value Ref Range   Specimen Description BLOOD RIGHT HAND    Special Requests      BOTTLES DRAWN AEROBIC AND ANAEROBIC Blood Culture adequate volume Performed at [facility], [address] [place], [place] [address]    Culture PENDING    Report Status PENDING   Urinalysis, w/ Reflex to Culture (Infection Suspected) -Urine, Clean Catch     Status: Abnormal   Collection Time: 09/26/23  9:28 PM  Result Value Ref Range   Specimen Source URINE, CLEAN CATCH    Color, Urine AMBER (A) YELLOW    Comment: BIOCHEMICALS MAY BE AFFECTED BY COLOR    APPearance CLOUDY (A) CLEAR   Specific Gravity, Urine 1.025 1.005 - 1.030   pH 5.0 5.0 - 8.0   Glucose, UA NEGATIVE NEGATIVE mg/dL   Hgb urine dipstick SMALL (A) NEGATIVE   Bilirubin Urine SMALL (A) NEGATIVE   Ketones, ur NEGATIVE NEGATIVE mg/dL   Protein, ur 899 (A) NEGATIVE mg/dL   Nitrite NEGATIVE NEGATIVE   Leukocytes,Ua MODERATE (A) NEGATIVE   RBC / HPF 6-10 0 - 5 RBC/hpf   WBC, UA 6-10 0 - 5 WBC/hpf    Comment:        Reflex urine culture not performed if WBC <=10, OR if Squamous epithelial cells >5. If Squamous epithelial cells >5 suggest recollection.    Bacteria, UA MANY (A) NONE SEEN   Squamous Epithelial / HPF 21-50 0 - 5 /HPF   Mucus PRESENT     Comment: Performed at [facility], [address], [place], [place] [address]  Pregnancy, urine     Status: None   Collection Time: 09/26/23  9:28 PM  Result Value Ref Range   Preg Test, Ur NEGATIVE NEGATIVE    Comment:        THE SENSITIVITY OF THIS METHODOLOGY IS >20 mIU/mL. Performed at [facility], [address], [place], [place] [address]   Lactic acid, plasma     Status: Abnormal   Collection Time: 09/26/23 11:40 PM  Result Value Ref Range   Lactic Acid, Venous 2.7 (HH) 0.5 - 1.9 mmol/L    Comment: CRITICAL RESULT CALLED TO, READ BACK BY AND VERIFIED WITH CRABTREE,B @ 0028 ON 09/27/23 BY JUW Performed  at [facility], [address], [place], [place] [address]   Comprehensive metabolic panel     Status: Abnormal   Collection Time: 09/27/23  3:51 AM  Result Value Ref Range   Sodium 135 135 - 145 mmol/L   Potassium 3.9 3.5 - 5.1 mmol/L   Chloride 99 98 - 111 mmol/L   CO2 27 22 - 32 mmol/L   Glucose, Bld 117 (H) 70 - 99 mg/dL    Comment: Glucose reference range applies only to samples taken after fasting for at least 8 hours.   BUN 25 (H) 6 - 20 mg/dL   Creatinine, Ser 8.72 (H) 0.44 - 1.00 mg/dL   Calcium 8.2 (L) 8.9 - 10.3 mg/dL   Total Protein 6.6 6.5 - 8.1 g/dL   Albumin 2.2 (L) 3.5 - 5.0 g/dL   AST 11 (L)  15 - 41 U/L   ALT 9 0 - 44 U/L   Alkaline Phosphatase 74 38 - 126 U/L   Total Bilirubin 0.7 0.0 - 1.2 mg/dL   GFR, Estimated 55 (L) >60 mL/min    Comment: (NOTE) Calculated using the CKD-EPI Creatinine Equation (2021)    Anion gap 9 5 - 15    Comment: Performed at [facility], [address] [place], [place] [address]  CBC     Status: Abnormal   Collection Time: 09/27/23  3:51 AM  Result Value Ref Range   WBC 10.4 4.0 - 10.5 K/uL   RBC 3.44 (L) 3.87 - 5.11 MIL/uL   Hemoglobin 10.0 (L) 12.0 - 15.0 g/dL   HCT 69.5 (L) 63.9 - 53.9 %   MCV 88.4 80.0 - 100.0 fL   MCH 29.1 26.0 - 34.0 pg   MCHC 32.9 30.0 - 36.0 g/dL   RDW 86.9 88.4 - 84.4 %   Platelets 460 (H) 150 - 400 K/uL   nRBC 0.0 0.0 - 0.2 %    Comment: Performed at [facility], [address], [place], [place] [address]  Magnesium      Status: Abnormal   Collection Time: 09/27/23  3:51 AM  Result Value Ref Range   Magnesium  1.6 (L) 1.7 - 2.4 mg/dL    Comment: Performed at [facility], [address] [place], [place] [address]  Lactic acid, plasma     Status: None   Collection Time: 09/27/23  5:58 AM  Result Value Ref Range   Lactic Acid, Venous 1.2 0.5 - 1.9 mmol/L    Comment: Performed at [facility], [address], [place], [place] [address]  Lactic acid, plasma     Status: None   Collection Time: 09/27/23  7:35 AM  Result Value Ref Range   Lactic Acid, Venous 1.1 0.5 - 1.9 mmol/L    Comment: Performed at [facility], [address] [place], [place] [address]    DG Chest Portable 1 View Result Date: 09/27/2023 CLINICAL DATA:  NGT placement film. EXAM: PORTABLE CHEST 1 VIEW COMPARISON:  Portable chest yesterday at 9:56 p.m. FINDINGS: 2:06 a.m. NGT has been inserted, adequately intragastric with the tip in the proximal fundal area. The cardiac size is normal. The lungs are expiratory with bibasilar atelectasis without evidence of infiltrates or effusion. The mediastinum is normally outlined. Thoracic cage intact.  Overlying telemetry leads. IMPRESSION: 1. NGT adequately intragastric with the tip in the proximal fundal area. 2. Expiratory chest with bibasilar atelectasis. Electronically Signed   By: Francis Quam M.D.   On: 09/27/2023 02:23   CT ABDOMEN PELVIS WO CONTRAST Result  Date: 09/26/2023 CLINICAL DATA:  Left lower quadrant abdominal pain. EXAM: CT ABDOMEN AND PELVIS WITHOUT CONTRAST TECHNIQUE: Multidetector CT imaging of the abdomen and pelvis was performed following the standard protocol without IV contrast. RADIATION DOSE REDUCTION: This exam was performed according to the departmental dose-optimization program which includes automated exposure control, adjustment of the mA and/or kV according to patient size and/or use of iterative reconstruction technique. COMPARISON:  None Available. FINDINGS: Lower chest: There is minimal atelectasis in the lung bases. Hepatobiliary: The liver is enlarged. The gallbladder is dilated. Gallstones are likely present. There is mild gallbladder wall edema. There is no biliary ductal dilatation. Pancreas: Unremarkable. No pancreatic ductal dilatation or surrounding inflammatory changes. Spleen: Normal in size without focal abnormality. Adrenals/Urinary Tract: There is a cyst in the right kidney measuring 13 mm. Otherwise, the adrenal glands and kidneys are within normal limits. There is a small amount of air in the bladder. Stomach/Bowel: The stomach is moderately distended. There are multiple dilated small bowel loops measuring up to 4.4 cm in the central and upper abdomen. Transition point is seen centrally in the mid abdomen. Distal small bowel loops are decompressed. Colon is normal in size. Appendix appears normal. There is a large amount of stool throughout the colon. There is no free intraperitoneal air or pneumatosis. Vascular/Lymphatic: No significant vascular findings are present. No enlarged abdominal or pelvic lymph nodes. Reproductive: IUD is present, questionably  low-lying. Uterus is otherwise within normal limits. The ovaries are not well defined secondary to free fluid in the pelvis. There is questionable enlarged right ovary. Other: There is a moderate amount of ascites and interloop fluid throughout the abdomen and pelvis. There is edema throughout the omentum. There is a small fat containing umbilical hernia. Musculoskeletal: No acute or significant osseous findings. IMPRESSION: 1. Findings compatible with small bowel obstruction with transition point in the mid abdomen. 2. Moderate amount of ascites and interloop fluid throughout the abdomen and pelvis. 3. Edema throughout the omentum. 4. Questionable enlarged right ovary. Recommend further evaluation with pelvic ultrasound. 5. IUD is present, questionably low-lying. 6. Hepatomegaly. 7. Dilated gallbladder with probable gallstones and mild gallbladder wall edema. Correlate clinically for cholecystitis. 8. Small amount of air in the bladder. Electronically Signed   By: Greig Pique M.D.   On: 09/26/2023 23:06   DG Chest Port 1 View Result Date: 09/26/2023 CLINICAL DATA:  Questionable sepsis - evaluate for abnormality EXAM: PORTABLE CHEST 1 VIEW COMPARISON:  None available FINDINGS: Low lung volumes, bibasilar atelectasis. No effusions. Heart and mediastinal contours within normal limits. IMPRESSION: Low lung volumes, bibasilar atelectasis. Electronically Signed   By: Franky Crease M.D.   On: 09/26/2023 22:18     Assessment & Plan:  Catherine Combs is a 43 y.o. female with a history of substance use disorder, constipation, and prior c-section who presented to the emergency department for lower abdominal pain. Patient reports improvement of her pain since admission with sensation of needing to have a bowel movement and pass flatus this morning since being NPO and having an NG tube. Her abdomen is still distended and tender throughout with increased tenderness in the right lower quadrant. Bowel sounds are present,  but decreased. CT scan showed SBO with clear transition point and stool present in the colon. Given patient's chronic history of constipation, recent treatment with buprenorphine , and improvement in pain since admission, we will continue with conservative management of her SBO. - Continue to monitor patient's pain and bowel movement status - Continue NPO  and NG tube with ice chips to help relieve dry mouth - Administer gastrografin  challenge with subsequent KUB film 8 hours later for diagnostic and therapeutic intervention - Consider enema to relieve stool burden in the colon - Consider administering Miralax  to help passage of stool  All questions were answered to the satisfaction of the patient.   Catherine Combs 09/27/2023, 8:31 AM

## 2023-09-27 NOTE — Plan of Care (Signed)

## 2023-09-27 NOTE — Progress Notes (Signed)
   09/27/23 1251  TOC Brief Assessment  Insurance and Status Reviewed  Patient has primary care physician Yes  Home environment has been reviewed Home with family  Prior level of function: Independent  Prior/Current Home Services No current home services  Social Drivers of Health Review SDOH reviewed no interventions necessary  Readmission risk has been reviewed Yes  Transition of care needs no transition of care needs at this time   Transition of Care Department [facility]) has reviewed patient and no TOC needs have been identified at this time. We will continue to monitor patient advancement through interdisciplinary progression rounds. If new patient transition needs arise, please place a TOC consult.

## 2023-09-27 NOTE — ED Notes (Signed)
 ED TO INPATIENT HANDOFF REPORT  ED Nurse Name and Phone #:   S Name/Age/Gender Rosina Sake 43 y.o. female Room/Bed: APA11/APA11  Code Status   Code Status: Not on file  Home/SNF/Other Home Patient oriented to: self, place, time, and situation Is this baseline? Yes   Triage Complete: Triage complete  Chief Complaint Sepsis secondary to UTI (HCC) [A41.9, N39.0]  Triage Note Pt from home, complains of lower abdominal pain, back pain, and unresolved UTI symptoms (finished antibiotic course). Pt appears jaundice and per family member she look discolored. Denies CP and SOB. Endorses constipation, fatigue and intermittent sweating.   Allergies Allergies  Allergen Reactions   Hydrocodone  Itching and Nausea Only    Level of Care/Admitting Diagnosis ED Disposition     ED Disposition  Admit   Condition  --   Comment  Hospital Area: [facility] [[id number]]  Level of Care: Stepdown [14]  Covid Evaluation: Asymptomatic - no recent exposure (last 10 days) testing not required  Diagnosis: Sepsis secondary to UTI [facility]) [300253]  Admitting Physician: ADEFESO, OLADAPO [[id number]]  Attending Physician: ADEFESO, OLADAPO [[id number]]  Certification:: I certify this patient will need inpatient services for at least 2 midnights  Expected Medical Readiness: 09/29/2023          B Medical/Surgery History Past Medical History:  Diagnosis Date   DJD (degenerative joint disease)    Past Surgical History:  Procedure Laterality Date   CERVICAL CONE BIOPSY     CESAREAN SECTION       A IV Location/Drains/Wounds Patient Lines/Drains/Airways Status     Active Line/Drains/Airways     Name Placement date Placement time Site Days   Peripheral IV 09/26/23 20 G Left Hand 09/26/23  2131  Hand  1            Intake/Output Last 24 hours  Intake/Output Summary (Last 24 hours) at 09/27/2023 0127 Last data filed at 09/26/2023 2209 Gross per 24 hour  Intake 100 ml  Output --   Net 100 ml    Labs/Imaging Results for orders placed or performed during the hospital encounter of 09/26/23 (from the past 48 hours)  Resp panel by RT-PCR (RSV, Flu A&B, Covid) Anterior Nasal Swab     Status: None   Collection Time: 09/26/23  9:28 PM   Specimen: Anterior Nasal Swab  Result Value Ref Range   SARS Coronavirus 2 by RT PCR NEGATIVE NEGATIVE    Comment: (NOTE) SARS-CoV-2 target nucleic acids are NOT DETECTED.  The SARS-CoV-2 RNA is generally detectable in upper respiratory specimens during the acute phase of infection. The lowest concentration of SARS-CoV-2 viral copies this assay can detect is 138 copies/mL. A negative result does not preclude SARS-Cov-2 infection and should not be used as the sole basis for treatment or other patient management decisions. A negative result may occur with  improper specimen collection/handling, submission of specimen other than nasopharyngeal swab, presence of viral mutation(s) within the areas targeted by this assay, and inadequate number of viral copies(<138 copies/mL). A negative result must be combined with clinical observations, patient history, and epidemiological information. The expected result is Negative.  Fact Sheet for Patients:  BloggerCourse.com  Fact Sheet for Healthcare Providers:  SeriousBroker.it  This test is no t yet approved or cleared by the [place]  FDA and  has been authorized for detection and/or diagnosis of SARS-CoV-2 by FDA under an Emergency Use Authorization (EUA). This EUA will remain  in effect (meaning this test can be used) for the duration  of the COVID-19 declaration under Section 564(b)(1) of the Act, 21 U.S.C.section 360bbb-3(b)(1), unless the authorization is terminated  or revoked sooner.       Influenza A by PCR NEGATIVE NEGATIVE   Influenza B by PCR NEGATIVE NEGATIVE    Comment: (NOTE) The Xpert Xpress SARS-CoV-2/FLU/RSV plus  assay is intended as an aid in the diagnosis of influenza from Nasopharyngeal swab specimens and should not be used as a sole basis for treatment. Nasal washings and aspirates are unacceptable for Xpert Xpress SARS-CoV-2/FLU/RSV testing.  Fact Sheet for Patients: BloggerCourse.com  Fact Sheet for Healthcare Providers: SeriousBroker.it  This test is not yet approved or cleared by the [place]  FDA and has been authorized for detection and/or diagnosis of SARS-CoV-2 by FDA under an Emergency Use Authorization (EUA). This EUA will remain in effect (meaning this test can be used) for the duration of the COVID-19 declaration under Section 564(b)(1) of the Act, 21 U.S.C. section 360bbb-3(b)(1), unless the authorization is terminated or revoked.     Resp Syncytial Virus by PCR NEGATIVE NEGATIVE    Comment: (NOTE) Fact Sheet for Patients: BloggerCourse.com  Fact Sheet for Healthcare Providers: SeriousBroker.it  This test is not yet approved or cleared by the [place]  FDA and has been authorized for detection and/or diagnosis of SARS-CoV-2 by FDA under an Emergency Use Authorization (EUA). This EUA will remain in effect (meaning this test can be used) for the duration of the COVID-19 declaration under Section 564(b)(1) of the Act, 21 U.S.C. section 360bbb-3(b)(1), unless the authorization is terminated or revoked.  Performed at [facility], [address] [place], [place] [address]   Lactic acid, plasma     Status: Abnormal   Collection Time: 09/26/23  9:28 PM  Result Value Ref Range   Lactic Acid, Venous 2.7 (HH) 0.5 - 1.9 mmol/L    Comment: CRITICAL RESULT CALLED TO, READ BACK BY AND VERIFIED WITH BONIS,G ON 09/26/23 AT 2230 BY PURDIE,J Performed at [facility], [address] [place], [place] [address]   Comprehensive metabolic panel     Status: Abnormal   Collection  Time: 09/26/23  9:28 PM  Result Value Ref Range   Sodium 131 (L) 135 - 145 mmol/L   Potassium 4.2 3.5 - 5.1 mmol/L   Chloride 93 (L) 98 - 111 mmol/L   CO2 22 22 - 32 mmol/L   Glucose, Bld 176 (H) 70 - 99 mg/dL    Comment: Glucose reference range applies only to samples taken after fasting for at least 8 hours.   BUN 32 (H) 6 - 20 mg/dL   Creatinine, Ser 7.77 (H) 0.44 - 1.00 mg/dL   Calcium 8.7 (L) 8.9 - 10.3 mg/dL   Total Protein 7.9 6.5 - 8.1 g/dL   Albumin 2.7 (L) 3.5 - 5.0 g/dL   AST 16 15 - 41 U/L   ALT 11 0 - 44 U/L   Alkaline Phosphatase 88 38 - 126 U/L   Total Bilirubin 1.0 0.0 - 1.2 mg/dL   GFR, Estimated 28 (L) >60 mL/min    Comment: (NOTE) Calculated using the CKD-EPI Creatinine Equation (2021)    Anion gap 16 (H) 5 - 15    Comment: Performed at [facility], [address] [place], [place] [address]  CBC with Differential     Status: Abnormal   Collection Time: 09/26/23  9:28 PM  Result Value Ref Range   WBC 14.3 (H) 4.0 - 10.5 K/uL   RBC 4.17 3.87 - 5.11 MIL/uL  Hemoglobin 11.9 (L) 12.0 - 15.0 g/dL   HCT 63.1 63.9 - 53.9 %   MCV 88.2 80.0 - 100.0 fL   MCH 28.5 26.0 - 34.0 pg   MCHC 32.3 30.0 - 36.0 g/dL   RDW 86.9 88.4 - 84.4 %   Platelets 633 (H) 150 - 400 K/uL   nRBC 0.0 0.0 - 0.2 %   Neutrophils Relative % 89 %   Neutro Abs 12.5 (H) 1.7 - 7.7 K/uL   Lymphocytes Relative 7 %   Lymphs Abs 0.9 0.7 - 4.0 K/uL   Monocytes Relative 3 %   Monocytes Absolute 0.4 0.1 - 1.0 K/uL   Eosinophils Relative 0 %   Eosinophils Absolute 0.0 0.0 - 0.5 K/uL   Basophils Relative 0 %   Basophils Absolute 0.1 0.0 - 0.1 K/uL   Immature Granulocytes 1 %   Abs Immature Granulocytes 0.17 (H) 0.00 - 0.07 K/uL    Comment: Performed at [facility], [address], [place], [place] [address]  Protime-INR     Status: Abnormal   Collection Time: 09/26/23  9:28 PM  Result Value Ref Range   Prothrombin Time 16.7 (H) 11.4 - 15.2 seconds   INR 1.3 (H) 0.8 - 1.2    Comment: (NOTE) INR  goal varies based on device and disease states. Performed at [facility], [address], [place], [place] [address]   Blood Culture (routine x 2)     Status: None (Preliminary result)   Collection Time: 09/26/23  9:28 PM   Specimen: BLOOD LEFT HAND  Result Value Ref Range   Specimen Description BLOOD LEFT HAND    Special Requests      BOTTLES DRAWN AEROBIC AND ANAEROBIC Blood Culture adequate volume Performed at [facility], [address] [place], [place] [address]    Culture PENDING    Report Status PENDING   Blood Culture (routine x 2)     Status: None (Preliminary result)   Collection Time: 09/26/23  9:28 PM   Specimen: BLOOD RIGHT HAND  Result Value Ref Range   Specimen Description BLOOD RIGHT HAND    Special Requests      BOTTLES DRAWN AEROBIC AND ANAEROBIC Blood Culture adequate volume Performed at [facility], [address], [place], [place] [address]    Culture PENDING    Report Status PENDING   Urinalysis, w/ Reflex to Culture (Infection Suspected) -Urine, Clean Catch     Status: Abnormal   Collection Time: 09/26/23  9:28 PM  Result Value Ref Range   Specimen Source URINE, CLEAN CATCH    Color, Urine AMBER (A) YELLOW    Comment: BIOCHEMICALS MAY BE AFFECTED BY COLOR   APPearance CLOUDY (A) CLEAR   Specific Gravity, Urine 1.025 1.005 - 1.030   pH 5.0 5.0 - 8.0   Glucose, UA NEGATIVE NEGATIVE mg/dL   Hgb urine dipstick SMALL (A) NEGATIVE   Bilirubin Urine SMALL (A) NEGATIVE   Ketones, ur NEGATIVE NEGATIVE mg/dL   Protein, ur 899 (A) NEGATIVE mg/dL   Nitrite NEGATIVE NEGATIVE   Leukocytes,Ua MODERATE (A) NEGATIVE   RBC / HPF 6-10 0 - 5 RBC/hpf   WBC, UA 6-10 0 - 5 WBC/hpf    Comment:        Reflex urine culture not performed if WBC <=10, OR if Squamous epithelial cells >5. If Squamous epithelial cells >5 suggest recollection.    Bacteria, UA MANY (A) NONE SEEN   Squamous Epithelial / HPF 21-50 0 - 5 /HPF  Mucus PRESENT     Comment: Performed at [facility], [address], [place], [place] [address]  Pregnancy, urine     Status: None   Collection Time: 09/26/23  9:28 PM  Result Value Ref Range   Preg Test, Ur NEGATIVE NEGATIVE    Comment:        THE SENSITIVITY OF THIS METHODOLOGY IS >20 mIU/mL. Performed at [facility], [address], [place], [place] [address]   Lactic acid, plasma     Status: Abnormal   Collection Time: 09/26/23 11:40 PM  Result Value Ref Range   Lactic Acid, Venous 2.7 (HH) 0.5 - 1.9 mmol/L    Comment: CRITICAL RESULT CALLED TO, READ BACK BY AND VERIFIED WITH Alianis Trimmer,B @ 0028 ON 09/27/23 BY JUW Performed at [facility], [address] [place], [place] [address]    CT ABDOMEN PELVIS WO CONTRAST Result Date: 09/26/2023 CLINICAL DATA:  Left lower quadrant abdominal pain. EXAM: CT ABDOMEN AND PELVIS WITHOUT CONTRAST TECHNIQUE: Multidetector CT imaging of the abdomen and pelvis was performed following the standard protocol without IV contrast. RADIATION DOSE REDUCTION: This exam was performed according to the departmental dose-optimization program which includes automated exposure control, adjustment of the mA and/or kV according to patient size and/or use of iterative reconstruction technique. COMPARISON:  None Available. FINDINGS: Lower chest: There is minimal atelectasis in the lung bases. Hepatobiliary: The liver is enlarged. The gallbladder is dilated. Gallstones are likely present. There is mild gallbladder wall edema. There is no biliary ductal dilatation. Pancreas: Unremarkable. No pancreatic ductal dilatation or surrounding inflammatory changes. Spleen: Normal in size without focal abnormality. Adrenals/Urinary Tract: There is a cyst in the right kidney measuring 13 mm. Otherwise, the adrenal glands and kidneys are within normal limits. There is a small amount of air in the bladder. Stomach/Bowel: The stomach is moderately distended. There are multiple dilated small bowel loops measuring up to 4.4 cm in the central  and upper abdomen. Transition point is seen centrally in the mid abdomen. Distal small bowel loops are decompressed. Colon is normal in size. Appendix appears normal. There is a large amount of stool throughout the colon. There is no free intraperitoneal air or pneumatosis. Vascular/Lymphatic: No significant vascular findings are present. No enlarged abdominal or pelvic lymph nodes. Reproductive: IUD is present, questionably low-lying. Uterus is otherwise within normal limits. The ovaries are not well defined secondary to free fluid in the pelvis. There is questionable enlarged right ovary. Other: There is a moderate amount of ascites and interloop fluid throughout the abdomen and pelvis. There is edema throughout the omentum. There is a small fat containing umbilical hernia. Musculoskeletal: No acute or significant osseous findings. IMPRESSION: 1. Findings compatible with small bowel obstruction with transition point in the mid abdomen. 2. Moderate amount of ascites and interloop fluid throughout the abdomen and pelvis. 3. Edema throughout the omentum. 4. Questionable enlarged right ovary. Recommend further evaluation with pelvic ultrasound. 5. IUD is present, questionably low-lying. 6. Hepatomegaly. 7. Dilated gallbladder with probable gallstones and mild gallbladder wall edema. Correlate clinically for cholecystitis. 8. Small amount of air in the bladder. Electronically Signed   By: Greig Pique M.D.   On: 09/26/2023 23:06   DG Chest Port 1 View Result Date: 09/26/2023 CLINICAL DATA:  Questionable sepsis - evaluate for abnormality EXAM: PORTABLE CHEST 1 VIEW COMPARISON:  None available FINDINGS: Low lung volumes, bibasilar atelectasis. No effusions. Heart and mediastinal contours within normal limits. IMPRESSION: Low lung volumes, bibasilar atelectasis. Electronically Signed  By: Franky Crease M.D.   On: 09/26/2023 22:18    Pending Labs Unresulted Labs (From admission, onward)    None        Vitals/Pain Today's Vitals   09/26/23 2357 09/27/23 0000 09/27/23 0030 09/27/23 0100  BP: 112/65 (!) 97/55 91/69 96/72   Pulse: (!) 105 100  100  Resp: 20     Temp: 97.9 F (36.6 C)     TempSrc: Oral     SpO2: 97% 95%  94%  PainSc: 5        Isolation Precautions No active isolations  Medications Medications  lactated ringers  infusion ( Intravenous New Bag/Given 09/26/23 2142)  lactated ringers  bolus 1,000 mL (0 mLs Intravenous Stopped 09/26/23 2356)    And  lactated ringers  bolus 1,000 mL (0 mLs Intravenous Stopped 09/27/23 0127)    And  lactated ringers  bolus 1,000 mL (0 mLs Intravenous Stopped 09/27/23 0127)  cefTRIAXone  (ROCEPHIN ) 2 g in sodium chloride  0.9 % 100 mL IVPB (0 g Intravenous Stopped 09/26/23 2209)  ondansetron  (ZOFRAN ) injection 4 mg (4 mg Intravenous Given 09/26/23 2139)    Mobility walks     Focused Assessments    R Recommendations: See Admitting Provider Note  Report given to:   Additional Notes:

## 2023-09-27 NOTE — Hospital Course (Signed)
 43 year old female with a history of SUDS, constipation, ADHD presenting with about 3-week history of abdominal pain and constipation.  She thought she may have had a UTI or kidney stones.  She was started on Bactrim DS by her PCP about 3 weeks ago.  Unfortunately, she continued to have lower abdominal pain and also complained of urinary frequency.  She denies any fevers, chills, chest pain, headache, neck pain, cough, hemoptysis.  She denies any hematochezia or melena. In the ED, the patient was afebrile and tachycardic.  Oxygen saturation was 94% room air.  WBC 14.3, hemoglobin 11.9, platelets 633.  Sodium 131, potassium 4.2, bicarbonate 22, serum creatinine 2.22.  AST 16, ALT 11, alk phosphatase 88, total bilirubin 1.0.  Lactic acid 2.7>> 1.2. CT abdomen and pelvis showed SBO with transition point in mid abdomen.  There is moderate ascites with interloop fluid.  There is a large amount of stool in the colon.  There was mental edema.  There was a distended gallbladder with mild gallbladder wall edema.  NG tube was placed for decompression.  The patient was started on IV fluids.  Small bowel study was negative for obstruction on 09/26/2021.  NG was removed on 09/28/2023.  She was started on clear liquid diet.  The patient continued to have some mild hypotension despite aggressive fluid resuscitation.  She was started on low-dose Levophed  on the early morning 09/28/2023.

## 2023-09-27 NOTE — Progress Notes (Signed)
 PROGRESS NOTE  Catherine Combs FMW:[id number] DOB: [birth year] DOA: 09/26/2023 PCP: Toribio Jerel MATSU, MD  Brief History:  43 year old female with a history of SUDS, constipation, ADHD presenting with about 3-week history of abdominal pain and constipation.  She thought she may have had a UTI or kidney stones.  She was started on Bactrim DS by her PCP about 3 weeks ago.  Unfortunately, she continued to have lower abdominal pain and also complained of urinary frequency.  She denies any fevers, chills, chest pain, headache, neck pain, cough, hemoptysis.  She denies any hematochezia or melena. In the ED, the patient was afebrile and tachycardic.  Oxygen saturation was 94% room air.  WBC 14.3, hemoglobin 11.9, platelets 633.  Sodium 131, potassium 4.2, bicarbonate 22, serum creatinine 2.22.  AST 16, ALT 11, alk phosphatase 88, total bilirubin 1.0.  Lactic acid 2.7>> 1.2. CT abdomen and pelvis showed SBO with transition point in mid abdomen.  There is moderate ascites with interloop fluid.  There is a large amount of stool in the colon.  There was mental edema.  There was a distended gallbladder with mild gallbladder wall edema.  NG tube was placed for decompression.  The patient was started on IV fluids.   Assessment/Plan: Small bowel obstruction - Continue NG decompression - Continue IV fluids - General Surgery consult  AKI -presented with serum creatinine 2.22 -continue IVF>>improving  SUDS - If able, would like to restart subutex  and clamp NG temporarily if possible - Judicious opioids - PDMP reviewed buprenorphine  8 mg, #15, last refill 09/22/2023 - Adderall 20 mg, #60, last refill 09/22/2023 - Xanax  1 mg, #60, last refill 09/14/2023  Anxiety - If able, give Xanax  and clamp NG if possible - IV Ativan  for now  Lactic acidosis - Secondary to hypoperfusion - Follow blood cultures - Improving with IV fluids - Lactic acid 2.7>> 1.2  Dysuria - UA 6-10 WBC - Continue ceftriaxone   another 24 hours pending culture data  Enlarged right ovary - Incidental finding on CT abdomen and pelvis - Outpatient follow-up         Family Communication:   no Family at bedside  Consultants:  general surgery  Code Status:  FULL   DVT Prophylaxis:  [place] Heparin     Procedures: As Listed in Progress Note Above  Antibiotics: None       Subjective: Patient states abdominal pain is a little better.  She denies any nausea, vomiting.  She is not passing any flatus.  No bowel movement.  Denies any fevers, chills, chest pain, hematochezia, melena.  Objective: Vitals:   09/27/23 0500 09/27/23 0600 09/27/23 0700 09/27/23 0800  BP: 90/64 110/70 100/68   Pulse: (!) 111 (!) 116 (!) 119   Resp: 17 19 17    Temp:    98.2 F (36.8 C)  TempSrc:    Oral  SpO2: 94% 93% 92%     Intake/Output Summary (Last 24 hours) at 09/27/2023 0809 Last data filed at 09/27/2023 0307 Gross per 24 hour  Intake 3912.5 ml  Output --  Net 3912.5 ml   Weight change:  Exam:  General:  Pt is alert, follows commands appropriately, not in acute distress HEENT: No icterus, No thrush, No neck mass, [place]/AT Cardiovascular: RRR, S1/S2, no rubs, no gallops Respiratory: CTA bilaterally, no wheezing, no crackles, no rhonchi Abdomen: Soft/+BS, mild diffuse tender, non distended, no guarding Extremities: No edema, No lymphangitis, No petechiae, No rashes, no synovitis  Data Reviewed: I have personally reviewed following labs and imaging studies Basic Metabolic Panel: Recent Labs  Lab 09/26/23 2128 09/27/23 0351  NA 131* 135  K 4.2 3.9  CL 93* 99  CO2 22 27  GLUCOSE 176* 117*  BUN 32* 25*  CREATININE 2.22* 1.27*  CALCIUM 8.7* 8.2*  MG  --  1.6*   Liver Function Tests: Recent Labs  Lab 09/26/23 2128 09/27/23 0351  AST 16 11*  ALT 11 9  ALKPHOS 88 74  BILITOT 1.0 0.7  PROT 7.9 6.6  ALBUMIN 2.7* 2.2*   No results for input(s): LIPASE, AMYLASE in the last 168 hours. No results  for input(s): AMMONIA in the last 168 hours. Coagulation Profile: Recent Labs  Lab 09/26/23 2128  INR 1.3*   CBC: Recent Labs  Lab 09/26/23 2128 09/27/23 0351  WBC 14.3* 10.4  NEUTROABS 12.5*  --   HGB 11.9* 10.0*  HCT 36.8 30.4*  MCV 88.2 88.4  PLT 633* 460*   Cardiac Enzymes: No results for input(s): CKTOTAL, CKMB, CKMBINDEX, TROPONINI in the last 168 hours. BNP: Invalid input(s): POCBNP CBG: No results for input(s): GLUCAP in the last 168 hours. HbA1C: No results for input(s): HGBA1C in the last 72 hours. Urine analysis:    Component Value Date/Time   COLORURINE AMBER (A) 09/26/2023 2128   APPEARANCEUR CLOUDY (A) 09/26/2023 2128   LABSPEC 1.025 09/26/2023 2128   PHURINE 5.0 09/26/2023 2128   GLUCOSEU NEGATIVE 09/26/2023 2128   HGBUR SMALL (A) 09/26/2023 2128   BILIRUBINUR SMALL (A) 09/26/2023 2128   KETONESUR NEGATIVE 09/26/2023 2128   PROTEINUR 100 (A) 09/26/2023 2128   UROBILINOGEN 0.2 08/15/2013 0319   NITRITE NEGATIVE 09/26/2023 2128   LEUKOCYTESUR MODERATE (A) 09/26/2023 2128   Sepsis Labs: @LABRCNTIP (procalcitonin:4,lacticidven:4) ) Recent Results (from the past 240 hours)  Resp panel by RT-PCR (RSV, Flu A&B, Covid) Anterior Nasal Swab     Status: None   Collection Time: 09/26/23  9:28 PM   Specimen: Anterior Nasal Swab  Result Value Ref Range Status   SARS Coronavirus 2 by RT PCR NEGATIVE NEGATIVE Final    Comment: (NOTE) SARS-CoV-2 target nucleic acids are NOT DETECTED.  The SARS-CoV-2 RNA is generally detectable in upper respiratory specimens during the acute phase of infection. The lowest concentration of SARS-CoV-2 viral copies this assay can detect is 138 copies/mL. A negative result does not preclude SARS-Cov-2 infection and should not be used as the sole basis for treatment or other patient management decisions. A negative result may occur with  improper specimen collection/handling, submission of specimen other than  nasopharyngeal swab, presence of viral mutation(s) within the areas targeted by this assay, and inadequate number of viral copies(<138 copies/mL). A negative result must be combined with clinical observations, patient history, and epidemiological information. The expected result is Negative.  Fact Sheet for Patients:  BloggerCourse.com  Fact Sheet for Healthcare Providers:  SeriousBroker.it  This test is no t yet approved or cleared by the [place]  FDA and  has been authorized for detection and/or diagnosis of SARS-CoV-2 by FDA under an Emergency Use Authorization (EUA). This EUA will remain  in effect (meaning this test can be used) for the duration of the COVID-19 declaration under Section 564(b)(1) of the Act, 21 U.S.C.section 360bbb-3(b)(1), unless the authorization is terminated  or revoked sooner.       Influenza A by PCR NEGATIVE NEGATIVE Final   Influenza B by PCR NEGATIVE NEGATIVE Final    Comment: (NOTE) The Xpert Xpress SARS-CoV-2/FLU/RSV  plus assay is intended as an aid in the diagnosis of influenza from Nasopharyngeal swab specimens and should not be used as a sole basis for treatment. Nasal washings and aspirates are unacceptable for Xpert Xpress SARS-CoV-2/FLU/RSV testing.  Fact Sheet for Patients: BloggerCourse.com  Fact Sheet for Healthcare Providers: SeriousBroker.it  This test is not yet approved or cleared by the [place]  FDA and has been authorized for detection and/or diagnosis of SARS-CoV-2 by FDA under an Emergency Use Authorization (EUA). This EUA will remain in effect (meaning this test can be used) for the duration of the COVID-19 declaration under Section 564(b)(1) of the Act, 21 U.S.C. section 360bbb-3(b)(1), unless the authorization is terminated or revoked.     Resp Syncytial Virus by PCR NEGATIVE NEGATIVE Final    Comment:  (NOTE) Fact Sheet for Patients: BloggerCourse.com  Fact Sheet for Healthcare Providers: SeriousBroker.it  This test is not yet approved or cleared by the [place]  FDA and has been authorized for detection and/or diagnosis of SARS-CoV-2 by FDA under an Emergency Use Authorization (EUA). This EUA will remain in effect (meaning this test can be used) for the duration of the COVID-19 declaration under Section 564(b)(1) of the Act, 21 U.S.C. section 360bbb-3(b)(1), unless the authorization is terminated or revoked.  Performed at [facility], [address], [place], [place] [address]   Blood Culture (routine x 2)     Status: None (Preliminary result)   Collection Time: 09/26/23  9:28 PM   Specimen: BLOOD LEFT HAND  Result Value Ref Range Status   Specimen Description BLOOD LEFT HAND  Final   Special Requests   Final    BOTTLES DRAWN AEROBIC AND ANAEROBIC Blood Culture adequate volume Performed at [facility], [address], [place], [place] [address]    Culture PENDING  Incomplete   Report Status PENDING  Incomplete  Blood Culture (routine x 2)     Status: None (Preliminary result)   Collection Time: 09/26/23  9:28 PM   Specimen: BLOOD RIGHT HAND  Result Value Ref Range Status   Specimen Description BLOOD RIGHT HAND  Final   Special Requests   Final    BOTTLES DRAWN AEROBIC AND ANAEROBIC Blood Culture adequate volume Performed at [facility], [address] [place], [place] [address]    Culture PENDING  Incomplete   Report Status PENDING  Incomplete     Scheduled Meds:  heparin   5,000 Units Subcutaneous Q8H   Continuous Infusions:  lactated ringers  100 mL/hr at 09/27/23 0515    Procedures/Studies: DG Chest Portable 1 View Result Date: 09/27/2023 CLINICAL DATA:  NGT placement film. EXAM: PORTABLE CHEST 1 VIEW COMPARISON:  Portable chest yesterday at 9:56 p.m. FINDINGS: 2:06 a.m. NGT has been inserted, adequately  intragastric with the tip in the proximal fundal area. The cardiac size is normal. The lungs are expiratory with bibasilar atelectasis without evidence of infiltrates or effusion. The mediastinum is normally outlined. Thoracic cage intact. Overlying telemetry leads. IMPRESSION: 1. NGT adequately intragastric with the tip in the proximal fundal area. 2. Expiratory chest with bibasilar atelectasis. Electronically Signed   By: Francis Quam M.D.   On: 09/27/2023 02:23   CT ABDOMEN PELVIS WO CONTRAST Result Date: 09/26/2023 CLINICAL DATA:  Left lower quadrant abdominal pain. EXAM: CT ABDOMEN AND PELVIS WITHOUT CONTRAST TECHNIQUE: Multidetector CT imaging of the abdomen and pelvis was performed following the standard protocol without IV contrast. RADIATION DOSE REDUCTION: This exam was performed according to the departmental dose-optimization program which includes automated exposure  control, adjustment of the mA and/or kV according to patient size and/or use of iterative reconstruction technique. COMPARISON:  None Available. FINDINGS: Lower chest: There is minimal atelectasis in the lung bases. Hepatobiliary: The liver is enlarged. The gallbladder is dilated. Gallstones are likely present. There is mild gallbladder wall edema. There is no biliary ductal dilatation. Pancreas: Unremarkable. No pancreatic ductal dilatation or surrounding inflammatory changes. Spleen: Normal in size without focal abnormality. Adrenals/Urinary Tract: There is a cyst in the right kidney measuring 13 mm. Otherwise, the adrenal glands and kidneys are within normal limits. There is a small amount of air in the bladder. Stomach/Bowel: The stomach is moderately distended. There are multiple dilated small bowel loops measuring up to 4.4 cm in the central and upper abdomen. Transition point is seen centrally in the mid abdomen. Distal small bowel loops are decompressed. Colon is normal in size. Appendix appears normal. There is a large amount of  stool throughout the colon. There is no free intraperitoneal air or pneumatosis. Vascular/Lymphatic: No significant vascular findings are present. No enlarged abdominal or pelvic lymph nodes. Reproductive: IUD is present, questionably low-lying. Uterus is otherwise within normal limits. The ovaries are not well defined secondary to free fluid in the pelvis. There is questionable enlarged right ovary. Other: There is a moderate amount of ascites and interloop fluid throughout the abdomen and pelvis. There is edema throughout the omentum. There is a small fat containing umbilical hernia. Musculoskeletal: No acute or significant osseous findings. IMPRESSION: 1. Findings compatible with small bowel obstruction with transition point in the mid abdomen. 2. Moderate amount of ascites and interloop fluid throughout the abdomen and pelvis. 3. Edema throughout the omentum. 4. Questionable enlarged right ovary. Recommend further evaluation with pelvic ultrasound. 5. IUD is present, questionably low-lying. 6. Hepatomegaly. 7. Dilated gallbladder with probable gallstones and mild gallbladder wall edema. Correlate clinically for cholecystitis. 8. Small amount of air in the bladder. Electronically Signed   By: Greig Pique M.D.   On: 09/26/2023 23:06   DG Chest Port 1 View Result Date: 09/26/2023 CLINICAL DATA:  Questionable sepsis - evaluate for abnormality EXAM: PORTABLE CHEST 1 VIEW COMPARISON:  None available FINDINGS: Low lung volumes, bibasilar atelectasis. No effusions. Heart and mediastinal contours within normal limits. IMPRESSION: Low lung volumes, bibasilar atelectasis. Electronically Signed   By: Franky Crease M.D.   On: 09/26/2023 22:18    Alm Schneider, DO  [facility]  If 7PM-7AM, please contact night-coverage [email or web address] Password [email or web address] 09/27/2023, 8:09 AM   LOS: 1 day

## 2023-09-27 NOTE — Progress Notes (Signed)
 [facility]  Contrast in colon. Nonspecific bowel gas pattern. Will get KUB in AM. NPO NG for now.  Manuelita Pander, MD [facility] [address] [place] [address] [place], [place] [address] [phone] (office)

## 2023-09-27 NOTE — Plan of Care (Signed)
  Problem: Education: Goal: Knowledge of General Education information will improve Description: Including pain rating scale, medication(s)/side effects and non-pharmacologic comfort measures Outcome: Progressing   Problem: Coping: Goal: Level of anxiety will decrease Outcome: Progressing   Problem: Elimination: Goal: Will not experience complications related to urinary retention Outcome: Progressing   Problem: Pain Managment: Goal: General experience of comfort will improve and/or be controlled Outcome: Progressing

## 2023-09-28 ENCOUNTER — Inpatient Hospital Stay (HOSPITAL_COMMUNITY): Payer: Medicaid - Out of State

## 2023-09-28 DIAGNOSIS — E872 Acidosis, unspecified: Secondary | ICD-10-CM | POA: Diagnosis not present

## 2023-09-28 DIAGNOSIS — E861 Hypovolemia: Secondary | ICD-10-CM

## 2023-09-28 DIAGNOSIS — N179 Acute kidney failure, unspecified: Secondary | ICD-10-CM | POA: Diagnosis not present

## 2023-09-28 DIAGNOSIS — R6510 Systemic inflammatory response syndrome (SIRS) of non-infectious origin without acute organ dysfunction: Secondary | ICD-10-CM

## 2023-09-28 DIAGNOSIS — E871 Hypo-osmolality and hyponatremia: Secondary | ICD-10-CM | POA: Diagnosis not present

## 2023-09-28 DIAGNOSIS — K56609 Unspecified intestinal obstruction, unspecified as to partial versus complete obstruction: Secondary | ICD-10-CM | POA: Diagnosis not present

## 2023-09-28 LAB — GLUCOSE, CAPILLARY
Glucose-Capillary: 108 mg/dL — ABNORMAL HIGH (ref 70–99)
Glucose-Capillary: 89 mg/dL (ref 70–99)
Glucose-Capillary: 97 mg/dL (ref 70–99)

## 2023-09-28 LAB — PROCALCITONIN: Procalcitonin: 4.41 ng/mL

## 2023-09-28 LAB — BASIC METABOLIC PANEL WITH GFR
Anion gap: 10 (ref 5–15)
BUN: 22 mg/dL — ABNORMAL HIGH (ref 6–20)
CO2: 25 mmol/L (ref 22–32)
Calcium: 7.7 mg/dL — ABNORMAL LOW (ref 8.9–10.3)
Chloride: 101 mmol/L (ref 98–111)
Creatinine, Ser: 0.96 mg/dL (ref 0.44–1.00)
GFR, Estimated: >60 mL/min (ref >60)
Glucose, Bld: 87 mg/dL (ref 70–99)
Potassium: 4.1 mmol/L (ref 3.5–5.1)
Sodium: 136 mmol/L (ref 135–145)

## 2023-09-28 LAB — CBC
HCT: 24.1 % — ABNORMAL LOW (ref 36.0–46.0)
Hemoglobin: 7.6 g/dL — ABNORMAL LOW (ref 12.0–15.0)
MCH: 28.7 pg (ref 26.0–34.0)
MCHC: 31.5 g/dL (ref 30.0–36.0)
MCV: 90.9 fL (ref 80.0–100.0)
Platelets: 344 K/uL (ref 150–400)
RBC: 2.65 MIL/uL — ABNORMAL LOW (ref 3.87–5.11)
RDW: 13.4 % (ref 11.5–15.5)
WBC: 7.2 K/uL (ref 4.0–10.5)
nRBC: 0 % (ref 0.0–0.2)

## 2023-09-28 LAB — MAGNESIUM: Magnesium: 1.6 mg/dL — ABNORMAL LOW (ref 1.7–2.4)

## 2023-09-28 MED ORDER — BISACODYL 10 MG RE SUPP
10.0000 mg | Freq: Two times a day (BID) | RECTAL | Status: DC
  Administered 2023-09-28 (×2): 10 mg via RECTAL
  Filled 2023-09-28 (×3): qty 1

## 2023-09-28 MED ORDER — NOREPINEPHRINE 4 MG/250ML-% IV SOLN
0.0000 ug/min | INTRAVENOUS | Status: DC
  Administered 2023-09-28: 2 ug/min via INTRAVENOUS
  Filled 2023-09-28: qty 250

## 2023-09-28 MED ORDER — SODIUM CHLORIDE 0.9 % IV SOLN
250.0000 mL | INTRAVENOUS | Status: AC
  Administered 2023-09-28: 250 mL via INTRAVENOUS

## 2023-09-28 MED ORDER — LACTATED RINGERS IV SOLN: INTRAVENOUS | Status: DC

## 2023-09-28 MED ORDER — ALPRAZOLAM 0.5 MG PO TABS
0.5000 mg | ORAL_TABLET | Freq: Three times a day (TID) | ORAL | Status: DC | PRN
  Administered 2023-09-28 – 2023-09-29 (×2): 0.5 mg via ORAL
  Filled 2023-09-28 (×2): qty 1

## 2023-09-28 MED ORDER — MAGNESIUM SULFATE 2 GM/50ML IV SOLN
2.0000 g | Freq: Once | INTRAVENOUS | Status: AC
  Administered 2023-09-28: 2 g via INTRAVENOUS
  Filled 2023-09-28: qty 50

## 2023-09-28 MED ORDER — MIDODRINE HCL 5 MG PO TABS
5.0000 mg | ORAL_TABLET | Freq: Three times a day (TID) | ORAL | Status: DC
  Administered 2023-09-28 – 2023-09-29 (×3): 5 mg via ORAL
  Filled 2023-09-28 (×3): qty 1

## 2023-09-28 MED ORDER — PIPERACILLIN-TAZOBACTAM 3.375 G IVPB
3.3750 g | Freq: Three times a day (TID) | INTRAVENOUS | Status: DC
  Administered 2023-09-28 – 2023-09-30 (×6): 3.375 g via INTRAVENOUS
  Filled 2023-09-28 (×6): qty 50

## 2023-09-28 MED ORDER — LACTATED RINGERS IV BOLUS
500.0000 mL | Freq: Once | INTRAVENOUS | Status: AC
Start: 2023-09-28 — End: 2023-09-28
  Administered 2023-09-28: 500 mL via INTRAVENOUS

## 2023-09-28 NOTE — Progress Notes (Signed)
 Critical Care   Patient already received about 4 L of IV fluid and currently on 125 mL/h, but the BP continues to decrease with current BP at 73/46 with a MAP of 52.  Patient will be started on peripheral Levophed  at this time to maintain a MAP of 65 and above. At bedside, Catherine Combs was in no acute distress, NG tube drained about 350 mL of greenish fluid. Patient's location was changed to ICU.  Assessment and plan Hypotension Continue IV Levophed  as described above and wean patient off this as tolerated.   Please refer to admission H&P, progress notes and consult notes for details regarding the care of this patient   Critical time: 31 minutes   Critical care personally provided  managing the patient due to high probability of clinically significant and life threatening deterioration. This critical care time included obtaining a history; examining the patient, pulse oximetry; ordering and review of studies; arranging urgent treatment with development of a management plan; evaluation of patient's response of treatment; frequent reassessment; and discussions with other providers.  This critical care time was performed to assess and manage the high probability of imminent and life threatening deterioration that could result in multi-organ failure.

## 2023-09-28 NOTE — Progress Notes (Addendum)
 PROGRESS NOTE  Catherine Combs FMW:[id number] DOB: [birth year] DOA: 09/26/2023 PCP: Toribio Jerel MATSU, MD  Brief History:  43 year old female with a history of SUDS, constipation, ADHD presenting with about 3-week history of abdominal pain and constipation.  She thought she may have had a UTI or kidney stones.  She was started on Bactrim DS by her PCP about 3 weeks ago.  Unfortunately, she continued to have lower abdominal pain and also complained of urinary frequency.  She denies any fevers, chills, chest pain, headache, neck pain, cough, hemoptysis.  She denies any hematochezia or melena. In the ED, the patient was afebrile and tachycardic.  Oxygen saturation was 94% room air.  WBC 14.3, hemoglobin 11.9, platelets 633.  Sodium 131, potassium 4.2, bicarbonate 22, serum creatinine 2.22.  AST 16, ALT 11, alk phosphatase 88, total bilirubin 1.0.  Lactic acid 2.7>> 1.2. CT abdomen and pelvis showed SBO with transition point in mid abdomen.  There is moderate ascites with interloop fluid.  There is a large amount of stool in the colon.  There was mental edema.  There was a distended gallbladder with mild gallbladder wall edema.  NG tube was placed for decompression.  The patient was started on IV fluids.  Small bowel study was negative for obstruction on 09/26/2021.  NG was removed on 09/28/2023.  She was started on clear liquid diet.  The patient continued to have some mild hypotension despite aggressive fluid resuscitation.  She was started on low-dose Levophed  on the early morning 09/28/2023.   Assessment/Plan: Ileus - initially NG decompression - Continue IV fluids - General Surgery consult appreciated - 09/27/23 SB study>>no SBO - 09/28/23--remove NG and start clears, add bisacodyl  - 09/28/23--had BM   AKI -presented with serum creatinine 2.22 -continue IVF>>improved  Hypotension -required starting low dose levophed  9/23 early am after 6L IVF -repeat blood culture -UA >50 WBC, pt had  dysuria -start empiric zosyn  -continue IVF -start midodrine    SUDS - restarted buprenorphine  - PDMP reviewed buprenorphine  8 mg, #15, last refill 09/22/2023 - Adderall 20 mg, #60, last refill 09/22/2023 - Xanax  1 mg, #60, last refill 09/14/2023   Anxiety - restart xanax    Lactic acidosis - Secondary to hypoperfusion - Follow blood cultures - Improving with IV fluids - Lactic acid 2.7>> 1.2   Enlarged right ovary - Incidental finding on CT abdomen and pelvis - Outpatient follow-up  Hypomagnesemia -replete         The patient is critically ill with multiple organ systems failure and requires high complexity decision making for assessment and support, frequent evaluation and titration of therapies, application of advanced monitoring technologies and extensive interpretation of multiple databases.  Critical care time - 35 mins. In addition to the CC time spent by Dr. Manfred      Family Communication:   mother at bedside 09/27/23   Consultants:  general surgery   Code Status:  FULL    DVT Prophylaxis:   Heparin       Procedures: As Listed in Progress Note Above   Antibiotics: Zosyn  9/23>>         Subjective: Patient denies fevers, chills, headache, chest pain, dyspnea, nausea, vomiting, diarrhea, dysuria, hematuria, hematochezia, and melena.  States abd pain is improving   Objective: Vitals:   09/28/23 1200 09/28/23 1500 09/28/23 1530 09/28/23 1620  BP: (!) 110/98 101/73 93/66   Pulse: 86 (!) 106 (!) 105   Resp: 19 (!) 26 (!) 24  Temp:    98.8 F (37.1 C)  TempSrc:    Oral  SpO2:  91% 95%     Intake/Output Summary (Last 24 hours) at 09/28/2023 1703 Last data filed at 09/28/2023 1512 Gross per 24 hour  Intake 2942.41 ml  Output 600 ml  Net 2342.41 ml   Weight change:  Exam:  General:  Pt is alert, follows commands appropriately, not in acute distress HEENT: No icterus, No thrush, No neck mass, [place]/AT Cardiovascular: RRR, S1/S2, no rubs, no  gallops Respiratory: scattered rales. No wheeze Abdomen: Soft/+BS, mild generalized tender, non distended, no guarding Extremities: No edema, No lymphangitis, No petechiae, No rashes, no synovitis   Data Reviewed: I have personally reviewed following labs and imaging studies Basic Metabolic Panel: Recent Labs  Lab 09/26/23 2128 09/27/23 0351 09/28/23 0458  NA 131* 135 136  K 4.2 3.9 4.1  CL 93* 99 101  CO2 22 27 25   GLUCOSE 176* 117* 87  BUN 32* 25* 22*  CREATININE 2.22* 1.27* 0.96  CALCIUM 8.7* 8.2* 7.7*  MG  --  1.6* 1.6*   Liver Function Tests: Recent Labs  Lab 09/26/23 2128 09/27/23 0351  AST 16 11*  ALT 11 9  ALKPHOS 88 74  BILITOT 1.0 0.7  PROT 7.9 6.6  ALBUMIN 2.7* 2.2*   No results for input(s): LIPASE, AMYLASE in the last 168 hours. No results for input(s): AMMONIA in the last 168 hours. Coagulation Profile: Recent Labs  Lab 09/26/23 2128  INR 1.3*   CBC: Recent Labs  Lab 09/26/23 2128 09/27/23 0351 09/28/23 0458  WBC 14.3* 10.4 7.2  NEUTROABS 12.5*  --   --   HGB 11.9* 10.0* 7.6*  HCT 36.8 30.4* 24.1*  MCV 88.2 88.4 90.9  PLT 633* 460* 344   Cardiac Enzymes: No results for input(s): CKTOTAL, CKMB, CKMBINDEX, TROPONINI in the last 168 hours. BNP: Invalid input(s): POCBNP CBG: Recent Labs  Lab 09/28/23 1134  GLUCAP 97   HbA1C: No results for input(s): HGBA1C in the last 72 hours. Urine analysis:    Component Value Date/Time   COLORURINE AMBER (A) 09/27/2023 1800   APPEARANCEUR CLOUDY (A) 09/27/2023 1800   LABSPEC 1.030 09/27/2023 1800   PHURINE 6.0 09/27/2023 1800   GLUCOSEU NEGATIVE 09/27/2023 1800   HGBUR SMALL (A) 09/27/2023 1800   BILIRUBINUR NEGATIVE 09/27/2023 1800   KETONESUR NEGATIVE 09/27/2023 1800   PROTEINUR 100 (A) 09/27/2023 1800   UROBILINOGEN 0.2 08/15/2013 0319   NITRITE NEGATIVE 09/27/2023 1800   LEUKOCYTESUR MODERATE (A) 09/27/2023 1800   Sepsis  Labs: @LABRCNTIP (procalcitonin:4,lacticidven:4) ) Recent Results (from the past 240 hours)  Resp panel by RT-PCR (RSV, Flu A&B, Covid) Anterior Nasal Swab     Status: None   Collection Time: 09/26/23  9:28 PM   Specimen: Anterior Nasal Swab  Result Value Ref Range Status   SARS Coronavirus 2 by RT PCR NEGATIVE NEGATIVE Final    Comment: (NOTE) SARS-CoV-2 target nucleic acids are NOT DETECTED.  The SARS-CoV-2 RNA is generally detectable in upper respiratory specimens during the acute phase of infection. The lowest concentration of SARS-CoV-2 viral copies this assay can detect is 138 copies/mL. A negative result does not preclude SARS-Cov-2 infection and should not be used as the sole basis for treatment or other patient management decisions. A negative result may occur with  improper specimen collection/handling, submission of specimen other than nasopharyngeal swab, presence of viral mutation(s) within the areas targeted by this assay, and inadequate number of viral copies(<138 copies/mL). A  negative result must be combined with clinical observations, patient history, and epidemiological information. The expected result is Negative.  Fact Sheet for Patients:  BloggerCourse.com  Fact Sheet for Healthcare Providers:  SeriousBroker.it  This test is no t yet approved or cleared by the [place]  FDA and  has been authorized for detection and/or diagnosis of SARS-CoV-2 by FDA under an Emergency Use Authorization (EUA). This EUA will remain  in effect (meaning this test can be used) for the duration of the COVID-19 declaration under Section 564(b)(1) of the Act, 21 U.S.C.section 360bbb-3(b)(1), unless the authorization is terminated  or revoked sooner.       Influenza A by PCR NEGATIVE NEGATIVE Final   Influenza B by PCR NEGATIVE NEGATIVE Final    Comment: (NOTE) The Xpert Xpress SARS-CoV-2/FLU/RSV plus assay is intended as an  aid in the diagnosis of influenza from Nasopharyngeal swab specimens and should not be used as a sole basis for treatment. Nasal washings and aspirates are unacceptable for Xpert Xpress SARS-CoV-2/FLU/RSV testing.  Fact Sheet for Patients: BloggerCourse.com  Fact Sheet for Healthcare Providers: SeriousBroker.it  This test is not yet approved or cleared by the [place]  FDA and has been authorized for detection and/or diagnosis of SARS-CoV-2 by FDA under an Emergency Use Authorization (EUA). This EUA will remain in effect (meaning this test can be used) for the duration of the COVID-19 declaration under Section 564(b)(1) of the Act, 21 U.S.C. section 360bbb-3(b)(1), unless the authorization is terminated or revoked.     Resp Syncytial Virus by PCR NEGATIVE NEGATIVE Final    Comment: (NOTE) Fact Sheet for Patients: BloggerCourse.com  Fact Sheet for Healthcare Providers: SeriousBroker.it  This test is not yet approved or cleared by the [place]  FDA and has been authorized for detection and/or diagnosis of SARS-CoV-2 by FDA under an Emergency Use Authorization (EUA). This EUA will remain in effect (meaning this test can be used) for the duration of the COVID-19 declaration under Section 564(b)(1) of the Act, 21 U.S.C. section 360bbb-3(b)(1), unless the authorization is terminated or revoked.  Performed at [facility], [address] [place], [place] [address]   Blood Culture (routine x 2)     Status: None (Preliminary result)   Collection Time: 09/26/23  9:28 PM   Specimen: BLOOD LEFT HAND  Result Value Ref Range Status   Specimen Description BLOOD LEFT HAND  Final   Special Requests   Final    BOTTLES DRAWN AEROBIC AND ANAEROBIC Blood Culture adequate volume   Culture   Final    NO GROWTH 2 DAYS Performed at [facility], [address] [place], [place] [address]     Report Status PENDING  Incomplete  Blood Culture (routine x 2)     Status: None (Preliminary result)   Collection Time: 09/26/23  9:28 PM   Specimen: BLOOD RIGHT HAND  Result Value Ref Range Status   Specimen Description BLOOD RIGHT HAND  Final   Special Requests   Final    BOTTLES DRAWN AEROBIC AND ANAEROBIC Blood Culture adequate volume   Culture   Final    NO GROWTH 2 DAYS Performed at [facility], [address], [place], [place] [address]    Report Status PENDING  Incomplete  MRSA Next Gen by PCR, Nasal     Status: None   Collection Time: 09/27/23  2:30 AM   Specimen: Nasal Mucosa; Nasal Swab  Result Value Ref Range Status   MRSA by PCR Next Gen NOT DETECTED NOT DETECTED  Final    Comment: (NOTE) The GeneXpert MRSA Assay (FDA approved for NASAL specimens only), is one component of a comprehensive MRSA colonization surveillance program. It is not intended to diagnose MRSA infection nor to guide or monitor treatment for MRSA infections. Test performance is not FDA approved in patients less than 25 years old. Performed at [facility], [address], [place], [place] [address]      Scheduled Meds:  acetaminophen   1,000 mg Oral Q8H   bisacodyl   10 mg Rectal BID   buprenorphine   8 mg Sublingual Daily   Chlorhexidine  Gluconate Cloth  6 each Topical Daily   heparin   5,000 Units Subcutaneous Q8H   ketorolac   15 mg Intravenous Q6H   Continuous Infusions:  sodium chloride  10 mL/hr at 09/28/23 1156   magnesium  sulfate bolus IVPB     norepinephrine  (LEVOPHED ) Adult infusion 2 mcg/min (09/28/23 1156)   piperacillin -tazobactam (ZOSYN )  IV 3.375 g (09/28/23 1348)    Procedures/Studies: DG Abd 1 View Result Date: 09/28/2023 CLINICAL DATA:  Small-bowel obstruction protocol. Based on available prior imaging, this represents an approximately 19 hour delayed image. EXAM: ABDOMEN - 1 VIEW COMPARISON:  CT 09/26/2023.  Radiographs 09/27/2023. FINDINGS: 0437 hours. Supine and erect views  are submitted (3 total). Enteric tube projects into the proximal stomach. There is a small amount of residual contrast within the gastric fundus. Additional contrast has passed into the proximal colon which appears nondistended. No dilated loops of small bowel are identified. There is no evidence of contrast extravasation or pneumoperitoneum. Lower lung volumes with increased bibasilar pulmonary opacities, likely atelectasis. Possible small bilateral pleural effusions. Intrauterine device noted. The bones appear unremarkable. IMPRESSION: 1. No evidence of bowel obstruction or pneumoperitoneum. 2. Enteric tube projects into the proximal stomach. 3. Lower lung volumes with increased bibasilar atelectasis and possible small pleural effusions. Electronically Signed   By: Elsie Perone M.D.   On: 09/28/2023 08:39   DG Abd Portable 1V-Small Bowel Obstruction Protocol-initial, 8 hr delay Result Date: 09/27/2023 CLINICAL DATA:  8 hour delay, small-bowel obstruction. EXAM: DG ABD PORTABLE 1V COMPARISON:  None Available. FINDINGS: Contrast material seen within the stomach. Difficult to visualize contrast material elsewhere throughout the abdomen and pelvis. Gas within nondistended large and small bowel. No organomegaly or free air. IMPRESSION: Contrast within the stomach. Bowel-gas pattern nonspecific without clear evidence of bowel obstruction by plain film. Electronically Signed   By: Franky Crease M.D.   On: 09/27/2023 22:56   DG Chest Portable 1 View Result Date: 09/27/2023 CLINICAL DATA:  NGT placement film. EXAM: PORTABLE CHEST 1 VIEW COMPARISON:  Portable chest yesterday at 9:56 p.m. FINDINGS: 2:06 a.m. NGT has been inserted, adequately intragastric with the tip in the proximal fundal area. The cardiac size is normal. The lungs are expiratory with bibasilar atelectasis without evidence of infiltrates or effusion. The mediastinum is normally outlined. Thoracic cage intact. Overlying telemetry leads. IMPRESSION:  1. NGT adequately intragastric with the tip in the proximal fundal area. 2. Expiratory chest with bibasilar atelectasis. Electronically Signed   By: Francis Quam M.D.   On: 09/27/2023 02:23   CT ABDOMEN PELVIS WO CONTRAST Result Date: 09/26/2023 CLINICAL DATA:  Left lower quadrant abdominal pain. EXAM: CT ABDOMEN AND PELVIS WITHOUT CONTRAST TECHNIQUE: Multidetector CT imaging of the abdomen and pelvis was performed following the standard protocol without IV contrast. RADIATION DOSE REDUCTION: This exam was performed according to the departmental dose-optimization program which includes automated exposure control, adjustment of the mA and/or kV according  to patient size and/or use of iterative reconstruction technique. COMPARISON:  None Available. FINDINGS: Lower chest: There is minimal atelectasis in the lung bases. Hepatobiliary: The liver is enlarged. The gallbladder is dilated. Gallstones are likely present. There is mild gallbladder wall edema. There is no biliary ductal dilatation. Pancreas: Unremarkable. No pancreatic ductal dilatation or surrounding inflammatory changes. Spleen: Normal in size without focal abnormality. Adrenals/Urinary Tract: There is a cyst in the right kidney measuring 13 mm. Otherwise, the adrenal glands and kidneys are within normal limits. There is a small amount of air in the bladder. Stomach/Bowel: The stomach is moderately distended. There are multiple dilated small bowel loops measuring up to 4.4 cm in the central and upper abdomen. Transition point is seen centrally in the mid abdomen. Distal small bowel loops are decompressed. Colon is normal in size. Appendix appears normal. There is a large amount of stool throughout the colon. There is no free intraperitoneal air or pneumatosis. Vascular/Lymphatic: No significant vascular findings are present. No enlarged abdominal or pelvic lymph nodes. Reproductive: IUD is present, questionably low-lying. Uterus is otherwise within  normal limits. The ovaries are not well defined secondary to free fluid in the pelvis. There is questionable enlarged right ovary. Other: There is a moderate amount of ascites and interloop fluid throughout the abdomen and pelvis. There is edema throughout the omentum. There is a small fat containing umbilical hernia. Musculoskeletal: No acute or significant osseous findings. IMPRESSION: 1. Findings compatible with small bowel obstruction with transition point in the mid abdomen. 2. Moderate amount of ascites and interloop fluid throughout the abdomen and pelvis. 3. Edema throughout the omentum. 4. Questionable enlarged right ovary. Recommend further evaluation with pelvic ultrasound. 5. IUD is present, questionably low-lying. 6. Hepatomegaly. 7. Dilated gallbladder with probable gallstones and mild gallbladder wall edema. Correlate clinically for cholecystitis. 8. Small amount of air in the bladder. Electronically Signed   By: Greig Pique M.D.   On: 09/26/2023 23:06   DG Chest Port 1 View Result Date: 09/26/2023 CLINICAL DATA:  Questionable sepsis - evaluate for abnormality EXAM: PORTABLE CHEST 1 VIEW COMPARISON:  None available FINDINGS: Low lung volumes, bibasilar atelectasis. No effusions. Heart and mediastinal contours within normal limits. IMPRESSION: Low lung volumes, bibasilar atelectasis. Electronically Signed   By: Franky Crease M.D.   On: 09/26/2023 22:18    Alm Schneider, DO  [facility]  If 7PM-7AM, please contact night-coverage [email or web address] Password [email or web address] 09/28/2023, 5:03 PM   LOS: 2 days

## 2023-09-28 NOTE — Plan of Care (Signed)
   Problem: Nutrition: Goal: Adequate nutrition will be maintained Outcome: Progressing   Problem: Elimination: Goal: Will not experience complications related to bowel motility Outcome: Progressing Goal: Will not experience complications related to urinary retention Outcome: Progressing

## 2023-09-28 NOTE — Plan of Care (Signed)

## 2023-09-28 NOTE — Progress Notes (Signed)
 [facility] Progress Note     Subjective: Catherine Combs is a 43 yo F with a history of substance use disorder and chronic constipation who presented to the ED because of 3-week history of lower abdominal pain. General surgery was consulted for possible SBO. On interview today, she reports that her pain is slightly better compared to yesterday. She reports that her pain is still present in her lower abdomen, but endorses diffuse tenderness throughout the abdomen. She reports sleeping well overnight. She denies having a bowel movement since yesterday, but endorses passing flatus. She continues to endorse good urine output. She endorses being able to ambulate when necessary, but reports resting for most of the time because of the pain. She reports not having anything to eat since admission. She denies any fevers, chills, sweating, nausea, or vomiting. She denies any other symptoms today.  Objective: Vital signs in last 24 hours: Temp:  [97.9 F (36.6 C)-98.1 F (36.7 C)] 98.1 F (36.7 C) (09/23 0735) Pulse Rate:  [87-129] 87 (09/23 0800) Resp:  [14-25] 17 (09/23 0800) BP: (90-112)/(49-74) 106/67 (09/23 0800) SpO2:  [86 %-100 %] 98 % (09/23 0800) Last BM Date :  (One week ago per patient)  Intake/Output from previous day: 09/22 0701 - 09/23 0700 In: 5309.6 [I.V.:2194.5; NG/GT:180; IV Piggyback:2935.1] Out: 1500 [Urine:250; Emesis/NG output:1250] Intake/Output this shift: No intake/output data recorded.  General appearance: alert, cooperative, and mild distress Resp: clear to auscultation bilaterally and slightly decreased breath sounds Cardio: regular rate and rhythm, S1, S2 normal, no murmur, click, rub or gallop GI: soft, tender (concentrated lower abdominal tenderness); bowel sounds diminished  Lab Results:  Recent Labs    09/27/23 0351 09/28/23 0458  WBC 10.4 7.2  HGB 10.0* 7.6*  HCT 30.4* 24.1*  PLT 460* 344   BMET Recent Labs    09/27/23 0351  09/28/23 0458  NA 135 136  K 3.9 4.1  CL 99 101  CO2 27 25  GLUCOSE 117* 87  BUN 25* 22*  CREATININE 1.27* 0.96  CALCIUM 8.2* 7.7*   PT/INR Recent Labs    09/26/23 2128  LABPROT 16.7*  INR 1.3*    Studies/Results: DG Abd Portable 1V-Small Bowel Obstruction Protocol-initial, 8 hr delay Result Date: 09/27/2023 CLINICAL DATA:  8 hour delay, small-bowel obstruction. EXAM: DG ABD PORTABLE 1V COMPARISON:  None Available. FINDINGS: Contrast material seen within the stomach. Difficult to visualize contrast material elsewhere throughout the abdomen and pelvis. Gas within nondistended large and small bowel. No organomegaly or free air. IMPRESSION: Contrast within the stomach. Bowel-gas pattern nonspecific without clear evidence of bowel obstruction by plain film. Electronically Signed   By: Franky Crease M.D.   On: 09/27/2023 22:56   DG Chest Portable 1 View Result Date: 09/27/2023 CLINICAL DATA:  NGT placement film. EXAM: PORTABLE CHEST 1 VIEW COMPARISON:  Portable chest yesterday at 9:56 p.m. FINDINGS: 2:06 a.m. NGT has been inserted, adequately intragastric with the tip in the proximal fundal area. The cardiac size is normal. The lungs are expiratory with bibasilar atelectasis without evidence of infiltrates or effusion. The mediastinum is normally outlined. Thoracic cage intact. Overlying telemetry leads. IMPRESSION: 1. NGT adequately intragastric with the tip in the proximal fundal area. 2. Expiratory chest with bibasilar atelectasis. Electronically Signed   By: Francis Quam M.D.   On: 09/27/2023 02:23   CT ABDOMEN PELVIS WO CONTRAST Result Date: 09/26/2023 CLINICAL DATA:  Left lower quadrant abdominal pain. EXAM: CT ABDOMEN AND PELVIS WITHOUT CONTRAST TECHNIQUE: Multidetector CT imaging  of the abdomen and pelvis was performed following the standard protocol without IV contrast. RADIATION DOSE REDUCTION: This exam was performed according to the departmental dose-optimization program which  includes automated exposure control, adjustment of the mA and/or kV according to patient size and/or use of iterative reconstruction technique. COMPARISON:  None Available. FINDINGS: Lower chest: There is minimal atelectasis in the lung bases. Hepatobiliary: The liver is enlarged. The gallbladder is dilated. Gallstones are likely present. There is mild gallbladder wall edema. There is no biliary ductal dilatation. Pancreas: Unremarkable. No pancreatic ductal dilatation or surrounding inflammatory changes. Spleen: Normal in size without focal abnormality. Adrenals/Urinary Tract: There is a cyst in the right kidney measuring 13 mm. Otherwise, the adrenal glands and kidneys are within normal limits. There is a small amount of air in the bladder. Stomach/Bowel: The stomach is moderately distended. There are multiple dilated small bowel loops measuring up to 4.4 cm in the central and upper abdomen. Transition point is seen centrally in the mid abdomen. Distal small bowel loops are decompressed. Colon is normal in size. Appendix appears normal. There is a large amount of stool throughout the colon. There is no free intraperitoneal air or pneumatosis. Vascular/Lymphatic: No significant vascular findings are present. No enlarged abdominal or pelvic lymph nodes. Reproductive: IUD is present, questionably low-lying. Uterus is otherwise within normal limits. The ovaries are not well defined secondary to free fluid in the pelvis. There is questionable enlarged right ovary. Other: There is a moderate amount of ascites and interloop fluid throughout the abdomen and pelvis. There is edema throughout the omentum. There is a small fat containing umbilical hernia. Musculoskeletal: No acute or significant osseous findings. IMPRESSION: 1. Findings compatible with small bowel obstruction with transition point in the mid abdomen. 2. Moderate amount of ascites and interloop fluid throughout the abdomen and pelvis. 3. Edema throughout the  omentum. 4. Questionable enlarged right ovary. Recommend further evaluation with pelvic ultrasound. 5. IUD is present, questionably low-lying. 6. Hepatomegaly. 7. Dilated gallbladder with probable gallstones and mild gallbladder wall edema. Correlate clinically for cholecystitis. 8. Small amount of air in the bladder. Electronically Signed   By: Greig Pique M.D.   On: 09/26/2023 23:06   DG Chest Port 1 View Result Date: 09/26/2023 CLINICAL DATA:  Questionable sepsis - evaluate for abnormality EXAM: PORTABLE CHEST 1 VIEW COMPARISON:  None available FINDINGS: Low lung volumes, bibasilar atelectasis. No effusions. Heart and mediastinal contours within normal limits. IMPRESSION: Low lung volumes, bibasilar atelectasis. Electronically Signed   By: Franky Crease M.D.   On: 09/26/2023 22:18    Anti-infectives: Anti-infectives (From admission, onward)    Start     Dose/Rate Route Frequency Ordered Stop   09/26/23 2130  cefTRIAXone  (ROCEPHIN ) 2 g in sodium chloride  0.9 % 100 mL IVPB        2 g 200 mL/hr over 30 Minutes Intravenous Once 09/26/23 2121 09/26/23 2209       Assessment/Plan: s/p SBO  Catherine Combs is a 43 yo F with a history of substance use disorder and chronic constipation who was consulted for SBO. She continues to present with abdominal pain and tenderness with slight improvement overnight. She has passed flatus, but has not had a bowel movement yet. Gastrografin  challenge with subsequent KUB showed progression of dye into the colon, reducing concern for obstruction. Current management will continue to be conservative, focused on bowel movements, pain control, and PO intake to help relieve constipation. - Remove NG tube - Start liquid diet with small  sips - Administer suppository to help bowel function and clear existing stool - Continue to monitor pain and bowel movement   LOS: 2 days    Elia LULLA Blanch 09/28/2023

## 2023-09-29 DIAGNOSIS — K56609 Unspecified intestinal obstruction, unspecified as to partial versus complete obstruction: Secondary | ICD-10-CM | POA: Diagnosis not present

## 2023-09-29 LAB — BASIC METABOLIC PANEL WITH GFR
Anion gap: 11 (ref 5–15)
BUN: 19 mg/dL (ref 6–20)
CO2: 26 mmol/L (ref 22–32)
Calcium: 7.8 mg/dL — ABNORMAL LOW (ref 8.9–10.3)
Chloride: 98 mmol/L (ref 98–111)
Creatinine, Ser: 1 mg/dL (ref 0.44–1.00)
GFR, Estimated: >60 mL/min (ref >60)
Glucose, Bld: 90 mg/dL (ref 70–99)
Potassium: 4.1 mmol/L (ref 3.5–5.1)
Sodium: 135 mmol/L (ref 135–145)

## 2023-09-29 LAB — CBC
HCT: 26.6 % — ABNORMAL LOW (ref 36.0–46.0)
Hemoglobin: 8.4 g/dL — ABNORMAL LOW (ref 12.0–15.0)
MCH: 28.5 pg (ref 26.0–34.0)
MCHC: 31.6 g/dL (ref 30.0–36.0)
MCV: 90.2 fL (ref 80.0–100.0)
Platelets: 442 K/uL — ABNORMAL HIGH (ref 150–400)
RBC: 2.95 MIL/uL — ABNORMAL LOW (ref 3.87–5.11)
RDW: 13.5 % (ref 11.5–15.5)
WBC: 10.2 K/uL (ref 4.0–10.5)
nRBC: 0 % (ref 0.0–0.2)

## 2023-09-29 LAB — URINE CULTURE

## 2023-09-29 LAB — MAGNESIUM: Magnesium: 2.2 mg/dL (ref 1.7–2.4)

## 2023-09-29 LAB — GLUCOSE, CAPILLARY
Glucose-Capillary: 113 mg/dL — ABNORMAL HIGH (ref 70–99)
Glucose-Capillary: 137 mg/dL — ABNORMAL HIGH (ref 70–99)
Glucose-Capillary: 80 mg/dL (ref 70–99)
Glucose-Capillary: 84 mg/dL (ref 70–99)
Glucose-Capillary: 88 mg/dL (ref 70–99)
Glucose-Capillary: 91 mg/dL (ref 70–99)

## 2023-09-29 MED ORDER — POLYETHYLENE GLYCOL 3350 17 G PO PACK
17.0000 g | PACK | Freq: Every day | ORAL | Status: DC
  Administered 2023-09-29 – 2023-09-30 (×2): 17 g via ORAL
  Filled 2023-09-29 (×2): qty 1

## 2023-09-29 MED ORDER — MIDODRINE HCL 5 MG PO TABS
10.0000 mg | ORAL_TABLET | Freq: Three times a day (TID) | ORAL | Status: DC
  Administered 2023-09-29 – 2023-09-30 (×2): 10 mg via ORAL
  Filled 2023-09-29 (×2): qty 2

## 2023-09-29 NOTE — Plan of Care (Signed)

## 2023-09-29 NOTE — Plan of Care (Signed)
  Problem: Clinical Measurements: Goal: Diagnostic test results will improve Outcome: Progressing Goal: Respiratory complications will improve Outcome: Progressing Goal: Cardiovascular complication will be avoided Outcome: Progressing   Problem: Nutrition: Goal: Adequate nutrition will be maintained Outcome: Progressing   Problem: Coping: Goal: Level of anxiety will decrease Outcome: Progressing

## 2023-09-29 NOTE — Progress Notes (Signed)
 PROGRESS NOTE    Catherine Combs  FMW:[id number] DOB: Dec 18, 1980 DOA: 09/26/2023 PCP: Toribio Jerel MATSU, MD   Brief Narrative:    43 year old female with a history of SUDS, constipation, ADHD presenting with about 3-week history of abdominal pain and constipation. Small bowel study was negative for obstruction on 09/26/2021.  NG was removed on 09/28/2023.  She was started on clear liquid diet.  The patient continued to have some mild hypotension despite aggressive fluid resuscitation.  She was started on low-dose Levophed  on the early morning 09/28/2023.  This has now been weaned off and patient is on midodrine .  She has been started on soft diet per general surgery.  Assessment & Plan:   Principal Problem:   SBO (small bowel obstruction) (HCC) Active Problems:   Sepsis secondary to UTI (HCC)   Lactic acidosis   Thrombocytosis   Elevated serum creatinine   Hyponatremia   SIRS (systemic inflammatory response syndrome) (HCC)   AKI (acute kidney injury)  Assessment and Plan:   Ileus-resolving - initially NG decompression - General Surgery consult appreciated - 09/27/23 SB study>>no SBO - 09/28/23--remove NG and start clears, add bisacodyl  - 09/28/23--had BM -09/29/2023-started on soft diet per general surgery   AKI-resolved -presented with serum creatinine 2.22 - hold further IV fluid   Hypotension -required starting low dose levophed  9/23 early am after 6L IVF -repeat blood culture with no growth to date -UA >50 WBC, pt had dysuria - Continue empiric Zosyn  with procalcitonin 4.41 - Discontinue IV fluid - Continue midodrine    SUDS - restarted buprenorphine  - PDMP reviewed buprenorphine  8 mg, #15, last refill 09/22/2023 - Adderall 20 mg, #60, last refill 09/22/2023 - Xanax  1 mg, #60, last refill 09/14/2023   Anxiety - restart xanax    Lactic acidosis-resolved - Secondary to hypoperfusion - Follow blood cultures - Improving with IV fluids - Lactic acid 2.7>> 1.2   Enlarged  right ovary - Incidental finding on CT abdomen and pelvis - Outpatient follow-up    DVT prophylaxis: Heparin  Code Status: Full Family Communication: None at bedside Disposition Plan:  Status is: Inpatient Remains inpatient appropriate because: Need for IV medications.   Consultants:  General surgery  Procedures:  None  Antimicrobials:  Anti-infectives (From admission, onward)    Start     Dose/Rate Route Frequency Ordered Stop   09/28/23 1400  piperacillin -tazobactam (ZOSYN ) IVPB 3.375 g        3.375 g 12.5 mL/hr over 240 Minutes Intravenous Every 8 hours 09/28/23 1117     09/26/23 2130  cefTRIAXone  (ROCEPHIN ) 2 g in sodium chloride  0.9 % 100 mL IVPB        2 g 200 mL/hr over 30 Minutes Intravenous Once 09/26/23 2121 09/26/23 2209       Subjective: Patient seen and evaluated today with significant improvement in abdominal pain noted with no nausea or vomiting.  She denies any flatus or bowel movements.  Objective: Vitals:   09/29/23 0930 09/29/23 0945 09/29/23 1000 09/29/23 1045  BP: (!) 96/56 90/62 (!) 95/52 (!) 89/52  Pulse: 93 91 93 90  Resp: 14 16 16 16   Temp:      TempSrc:      SpO2: 94% 95% 96% 96%    Intake/Output Summary (Last 24 hours) at 09/29/2023 1331 Last data filed at 09/29/2023 0654 Gross per 24 hour  Intake 2462.82 ml  Output 300 ml  Net 2162.82 ml   There were no vitals filed for this visit.  Examination:  General exam: Appears calm  and comfortable  Respiratory system: Clear to auscultation. Respiratory effort normal. Cardiovascular system: S1 & S2 heard, RRR.  Gastrointestinal system: Abdomen is soft Central nervous system: Alert and awake Extremities: No edema Skin: No significant lesions noted Psychiatry: Flat affect.    Data Reviewed: I have personally reviewed following labs and imaging studies  CBC: Recent Labs  Lab 09/26/23 2128 09/27/23 0351 09/28/23 0458 09/29/23 0456  WBC 14.3* 10.4 7.2 10.2  NEUTROABS 12.5*  --    --   --   HGB 11.9* 10.0* 7.6* 8.4*  HCT 36.8 30.4* 24.1* 26.6*  MCV 88.2 88.4 90.9 90.2  PLT 633* 460* 344 442*   Basic Metabolic Panel: Recent Labs  Lab 09/26/23 2128 09/27/23 0351 09/28/23 0458 09/29/23 0456  NA 131* 135 136 135  K 4.2 3.9 4.1 4.1  CL 93* 99 101 98  CO2 22 27 25 26   GLUCOSE 176* 117* 87 90  BUN 32* 25* 22* 19  CREATININE 2.22* 1.27* 0.96 1.00  CALCIUM 8.7* 8.2* 7.7* 7.8*  MG  --  1.6* 1.6* 2.2   GFR: CrCl cannot be calculated (Unknown ideal weight.). Liver Function Tests: Recent Labs  Lab 09/26/23 2128 09/27/23 0351  AST 16 11*  ALT 11 9  ALKPHOS 88 74  BILITOT 1.0 0.7  PROT 7.9 6.6  ALBUMIN 2.7* 2.2*   No results for input(s): LIPASE, AMYLASE in the last 168 hours. No results for input(s): AMMONIA in the last 168 hours. Coagulation Profile: Recent Labs  Lab 09/26/23 2128  INR 1.3*   Cardiac Enzymes: No results for input(s): CKTOTAL, CKMB, CKMBINDEX, TROPONINI in the last 168 hours. BNP (last 3 results) No results for input(s): PROBNP in the last 8760 hours. HbA1C: No results for input(s): HGBA1C in the last 72 hours. CBG: Recent Labs  Lab 09/28/23 2047 09/28/23 2358 09/29/23 0332 09/29/23 0731 09/29/23 1124  GLUCAP 108* 89 91 80 84   Lipid Profile: No results for input(s): CHOL, HDL, LDLCALC, TRIG, CHOLHDL, LDLDIRECT in the last 72 hours. Thyroid Function Tests: No results for input(s): TSH, T4TOTAL, FREET4, T3FREE, THYROIDAB in the last 72 hours. Anemia Panel: No results for input(s): VITAMINB12, FOLATE, FERRITIN, TIBC, IRON, RETICCTPCT in the last 72 hours. Sepsis Labs: Recent Labs  Lab 09/26/23 2128 09/26/23 2340 09/27/23 0558 09/27/23 0735 09/28/23 1015  PROCALCITON  --   --   --   --  4.41  LATICACIDVEN 2.7* 2.7* 1.2 1.1  --     Recent Results (from the past 240 hours)  Resp panel by RT-PCR (RSV, Flu A&B, Covid) Anterior Nasal Swab     Status: None    Collection Time: 09/26/23  9:28 PM   Specimen: Anterior Nasal Swab  Result Value Ref Range Status   SARS Coronavirus 2 by RT PCR NEGATIVE NEGATIVE Final    Comment: (NOTE) SARS-CoV-2 target nucleic acids are NOT DETECTED.  The SARS-CoV-2 RNA is generally detectable in upper respiratory specimens during the acute phase of infection. The lowest concentration of SARS-CoV-2 viral copies this assay can detect is 138 copies/mL. A negative result does not preclude SARS-Cov-2 infection and should not be used as the sole basis for treatment or other patient management decisions. A negative result may occur with  improper specimen collection/handling, submission of specimen other than nasopharyngeal swab, presence of viral mutation(s) within the areas targeted by this assay, and inadequate number of viral copies(<138 copies/mL). A negative result must be combined with clinical observations, patient history, and epidemiological information. The expected result  is Negative.  Fact Sheet for Patients:  BloggerCourse.com  Fact Sheet for Healthcare Providers:  SeriousBroker.it  This test is no t yet approved or cleared by the [place]  FDA and  has been authorized for detection and/or diagnosis of SARS-CoV-2 by FDA under an Emergency Use Authorization (EUA). This EUA will remain  in effect (meaning this test can be used) for the duration of the COVID-19 declaration under Section 564(b)(1) of the Act, 21 U.S.C.section 360bbb-3(b)(1), unless the authorization is terminated  or revoked sooner.       Influenza A by PCR NEGATIVE NEGATIVE Final   Influenza B by PCR NEGATIVE NEGATIVE Final    Comment: (NOTE) The Xpert Xpress SARS-CoV-2/FLU/RSV plus assay is intended as an aid in the diagnosis of influenza from Nasopharyngeal swab specimens and should not be used as a sole basis for treatment. Nasal washings and aspirates are unacceptable for  Xpert Xpress SARS-CoV-2/FLU/RSV testing.  Fact Sheet for Patients: BloggerCourse.com  Fact Sheet for Healthcare Providers: SeriousBroker.it  This test is not yet approved or cleared by the [place]  FDA and has been authorized for detection and/or diagnosis of SARS-CoV-2 by FDA under an Emergency Use Authorization (EUA). This EUA will remain in effect (meaning this test can be used) for the duration of the COVID-19 declaration under Section 564(b)(1) of the Act, 21 U.S.C. section 360bbb-3(b)(1), unless the authorization is terminated or revoked.     Resp Syncytial Virus by PCR NEGATIVE NEGATIVE Final    Comment: (NOTE) Fact Sheet for Patients: BloggerCourse.com  Fact Sheet for Healthcare Providers: SeriousBroker.it  This test is not yet approved or cleared by the [place]  FDA and has been authorized for detection and/or diagnosis of SARS-CoV-2 by FDA under an Emergency Use Authorization (EUA). This EUA will remain in effect (meaning this test can be used) for the duration of the COVID-19 declaration under Section 564(b)(1) of the Act, 21 U.S.C. section 360bbb-3(b)(1), unless the authorization is terminated or revoked.  Performed at [facility], [address], [place], [place] [address]   Blood Culture (routine x 2)     Status: None (Preliminary result)   Collection Time: 09/26/23  9:28 PM   Specimen: BLOOD LEFT HAND  Result Value Ref Range Status   Specimen Description BLOOD LEFT HAND  Final   Special Requests   Final    BOTTLES DRAWN AEROBIC AND ANAEROBIC Blood Culture adequate volume   Culture   Final    NO GROWTH 3 DAYS Performed at [facility], [address] [place], [place] [address]    Report Status PENDING  Incomplete  Blood Culture (routine x 2)     Status: None (Preliminary result)   Collection Time: 09/26/23  9:28 PM   Specimen: BLOOD RIGHT HAND   Result Value Ref Range Status   Specimen Description BLOOD RIGHT HAND  Final   Special Requests   Final    BOTTLES DRAWN AEROBIC AND ANAEROBIC Blood Culture adequate volume   Culture   Final    NO GROWTH 3 DAYS Performed at [facility], [address], [place], [place] [address]    Report Status PENDING  Incomplete  MRSA Next Gen by PCR, Nasal     Status: None   Collection Time: 09/27/23  2:30 AM   Specimen: Nasal Mucosa; Nasal Swab  Result Value Ref Range Status   MRSA by PCR Next Gen NOT DETECTED NOT DETECTED Final    Comment: (NOTE) The GeneXpert MRSA Assay (FDA approved for NASAL specimens only),  is one component of a comprehensive MRSA colonization surveillance program. It is not intended to diagnose MRSA infection nor to guide or monitor treatment for MRSA infections. Test performance is not FDA approved in patients less than 36 years old. Performed at [facility], [address], [place], [place] [address]   Urine Culture (for pregnant, neutropenic or urologic patients or patients with an indwelling urinary catheter)     Status: Abnormal   Collection Time: 09/27/23  6:00 PM   Specimen: Urine, Clean Catch  Result Value Ref Range Status   Specimen Description   Final    URINE, CLEAN CATCH Performed at [facility], [address] [place], [place] [address]    Special Requests   Final    NONE Performed at [facility], [address], [place], [place] [address]    Culture MULTIPLE SPECIES PRESENT, SUGGEST RECOLLECTION (A)  Final   Report Status 09/29/2023 FINAL  Final  Culture, blood (Routine X 2) w Reflex to ID Panel     Status: None (Preliminary result)   Collection Time: 09/28/23 10:15 AM   Specimen: BLOOD  Result Value Ref Range Status   Specimen Description BLOOD BLOOD RIGHT HAND  Final   Special Requests   Final    BOTTLES DRAWN AEROBIC AND ANAEROBIC Blood Culture adequate volume   Culture   Final    NO GROWTH < 24 HOURS Performed at [facility], [address], [place], [place] [address]    Report Status PENDING  Incomplete  Culture, blood (Routine X 2) w Reflex to ID Panel     Status: None (Preliminary result)   Collection Time: 09/28/23 10:17 AM   Specimen: BLOOD  Result Value Ref Range Status   Specimen Description BLOOD BLOOD RIGHT ARM  Final   Special Requests   Final    BOTTLES DRAWN AEROBIC AND ANAEROBIC Blood Culture adequate volume   Culture   Final    NO GROWTH < 24 HOURS Performed at [facility], [address] [place], [place] [address]    Report Status PENDING  Incomplete         Radiology Studies: DG Abd 1 View Result Date: 09/28/2023 CLINICAL DATA:  Small-bowel obstruction protocol. Based on available prior imaging, this represents an approximately 19 hour delayed image. EXAM: ABDOMEN - 1 VIEW COMPARISON:  CT 09/26/2023.  Radiographs 09/27/2023. FINDINGS: 0437 hours. Supine and erect views are submitted (3 total). Enteric tube projects into the proximal stomach. There is a small amount of residual contrast within the gastric fundus. Additional contrast has passed into the proximal colon which appears nondistended. No dilated loops of small bowel are identified. There is no evidence of contrast extravasation or pneumoperitoneum. Lower lung volumes with increased bibasilar pulmonary opacities, likely atelectasis. Possible small bilateral pleural effusions. Intrauterine device noted. The bones appear unremarkable. IMPRESSION: 1. No evidence of bowel obstruction or pneumoperitoneum. 2. Enteric tube projects into the proximal stomach. 3. Lower lung volumes with increased bibasilar atelectasis and possible small pleural effusions. Electronically Signed   By: Elsie Perone M.D.   On: 09/28/2023 08:39   DG Abd Portable 1V-Small Bowel Obstruction Protocol-initial, 8 hr delay Result Date: 09/27/2023 CLINICAL DATA:  8 hour delay, small-bowel obstruction. EXAM: DG ABD PORTABLE 1V COMPARISON:  None Available. FINDINGS: Contrast material  seen within the stomach. Difficult to visualize contrast material elsewhere throughout the abdomen and pelvis. Gas within nondistended large and small bowel. No organomegaly or free air. IMPRESSION: Contrast within the stomach. Bowel-gas pattern  nonspecific without clear evidence of bowel obstruction by plain film. Electronically Signed   By: Franky Crease M.D.   On: 09/27/2023 22:56        Scheduled Meds:  acetaminophen   1,000 mg Oral Q8H   bisacodyl   10 mg Rectal BID   buprenorphine   8 mg Sublingual Daily   Chlorhexidine  Gluconate Cloth  6 each Topical Daily   heparin   5,000 Units Subcutaneous Q8H   ketorolac   15 mg Intravenous Q6H   midodrine   5 mg Oral TID WC   polyethylene glycol  17 g Oral Daily   Continuous Infusions:  lactated ringers  125 mL/hr at 09/29/23 1201   norepinephrine  (LEVOPHED ) Adult infusion Stopped (09/29/23 0546)   piperacillin -tazobactam (ZOSYN )  IV 12.5 mL/hr at 09/29/23 0654     LOS: 3 days    Time spent: 55 minutes    Jullianna Gabor D Maree, DO [facility]  If 7PM-7AM, please contact night-coverage [email or web address] 09/29/2023, 1:31 PM

## 2023-09-29 NOTE — Progress Notes (Signed)
 [facility] Progress Note     Subjective: Catherine Combs is a 43 yo F with a history of substance use disorder and chronic constipation who presented to the emergency department because of a 3-week history of lower abdominal pain. General surgery was consulted for possible SBO. On interview, she reports that she is doing much better today. She reports that her pain has resolved following a bowel movement after a suppository. She reports that she has been able to sleep well overnight. She reports good PO intake of liquids without nausea or vomiting. She endorses having a bowel movement, passing flatus, and having good urine output. She reports being able to ambulate when necessary, but was advised by her hospitalist team to rest. She denies any fevers, chills, sweating, or other symptoms today.  Objective: Vital signs in last 24 hours: Temp:  [98 F (36.7 C)-98.8 F (37.1 C)] 98 F (36.7 C) (09/24 0741) Pulse Rate:  [83-107] 93 (09/24 0600) Resp:  [15-27] 17 (09/24 0600) BP: (81-110)/(50-98) 96/65 (09/24 0600) SpO2:  [89 %-99 %] 94 % (09/24 0600) Last BM Date : 09/29/23  Intake/Output from previous day: 09/23 0701 - 09/24 0700 In: 3355.9 [P.O.:960; I.V.:2259.3; NG/GT:30; IV Piggyback:106.7] Out: 300 [Urine:300] Intake/Output this shift: No intake/output data recorded.  General appearance: alert, cooperative, and no distress Resp: clear to auscultation bilaterally Cardio: regular rate and rhythm, S1, S2 normal, no murmur, click, rub or gallop GI: soft, non-tender; bowel sounds normal; no masses,  no organomegaly  Lab Results:  Recent Labs    09/28/23 0458 09/29/23 0456  WBC 7.2 10.2  HGB 7.6* 8.4*  HCT 24.1* 26.6*  PLT 344 442*   BMET Recent Labs    09/28/23 0458 09/29/23 0456  NA 136 135  K 4.1 4.1  CL 101 98  CO2 25 26  GLUCOSE 87 90  BUN 22* 19  CREATININE 0.96 1.00  CALCIUM 7.7* 7.8*   PT/INR Recent Labs    09/26/23 2128  LABPROT 16.7*   INR 1.3*    Studies/Results: DG Abd 1 View Result Date: 09/28/2023 CLINICAL DATA:  Small-bowel obstruction protocol. Based on available prior imaging, this represents an approximately 19 hour delayed image. EXAM: ABDOMEN - 1 VIEW COMPARISON:  CT 09/26/2023.  Radiographs 09/27/2023. FINDINGS: 0437 hours. Supine and erect views are submitted (3 total). Enteric tube projects into the proximal stomach. There is a small amount of residual contrast within the gastric fundus. Additional contrast has passed into the proximal colon which appears nondistended. No dilated loops of small bowel are identified. There is no evidence of contrast extravasation or pneumoperitoneum. Lower lung volumes with increased bibasilar pulmonary opacities, likely atelectasis. Possible small bilateral pleural effusions. Intrauterine device noted. The bones appear unremarkable. IMPRESSION: 1. No evidence of bowel obstruction or pneumoperitoneum. 2. Enteric tube projects into the proximal stomach. 3. Lower lung volumes with increased bibasilar atelectasis and possible small pleural effusions. Electronically Signed   By: Elsie Perone M.D.   On: 09/28/2023 08:39   DG Abd Portable 1V-Small Bowel Obstruction Protocol-initial, 8 hr delay Result Date: 09/27/2023 CLINICAL DATA:  8 hour delay, small-bowel obstruction. EXAM: DG ABD PORTABLE 1V COMPARISON:  None Available. FINDINGS: Contrast material seen within the stomach. Difficult to visualize contrast material elsewhere throughout the abdomen and pelvis. Gas within nondistended large and small bowel. No organomegaly or free air. IMPRESSION: Contrast within the stomach. Bowel-gas pattern nonspecific without clear evidence of bowel obstruction by plain film. Electronically Signed   By: Franky Crease M.D.  On: 09/27/2023 22:56    Anti-infectives: Anti-infectives (From admission, onward)    Start     Dose/Rate Route Frequency Ordered Stop   09/28/23 1400  piperacillin -tazobactam (ZOSYN )  IVPB 3.375 g        3.375 g 12.5 mL/hr over 240 Minutes Intravenous Every 8 hours 09/28/23 1117     09/26/23 2130  cefTRIAXone  (ROCEPHIN ) 2 g in sodium chloride  0.9 % 100 mL IVPB        2 g 200 mL/hr over 30 Minutes Intravenous Once 09/26/23 2121 09/26/23 2209       Assessment/Plan: s/p possible SBO  Catherine Combs is a 43 yo F with a history of substance use disorder and chronic constipation who was consulted for possible SBO. She presents with no pain today after having a bowel movement. Her KUB post gastrografin  challenge showed dye penetration into the colon, reducing any concern for obstruction. Current management will continue to be conservative, focusing on regularity of bowel movement upon discharge and advancing diet. - Advance diet to soft diet - Supplement diet with fiber/miralax  - Continue to monitor patient's bowel movement status - Plan for discharge from a surgical standpoint tomorrow if tolerating diet advancements   LOS: 3 days    Catherine Combs 09/29/2023

## 2023-09-30 ENCOUNTER — Other Ambulatory Visit (HOSPITAL_COMMUNITY): Payer: Medicaid - Out of State

## 2023-09-30 DIAGNOSIS — K56609 Unspecified intestinal obstruction, unspecified as to partial versus complete obstruction: Secondary | ICD-10-CM | POA: Diagnosis not present

## 2023-09-30 LAB — CBC
HCT: 26.8 % — ABNORMAL LOW (ref 36.0–46.0)
Hemoglobin: 8.2 g/dL — ABNORMAL LOW (ref 12.0–15.0)
MCH: 28.4 pg (ref 26.0–34.0)
MCHC: 30.6 g/dL (ref 30.0–36.0)
MCV: 92.7 fL (ref 80.0–100.0)
Platelets: 445 K/uL — ABNORMAL HIGH (ref 150–400)
RBC: 2.89 MIL/uL — ABNORMAL LOW (ref 3.87–5.11)
RDW: 14.1 % (ref 11.5–15.5)
WBC: 9.9 K/uL (ref 4.0–10.5)
nRBC: 0 % (ref 0.0–0.2)

## 2023-09-30 LAB — BASIC METABOLIC PANEL WITH GFR
Anion gap: 5 (ref 5–15)
BUN: 22 mg/dL — ABNORMAL HIGH (ref 6–20)
CO2: 28 mmol/L (ref 22–32)
Calcium: 7.7 mg/dL — ABNORMAL LOW (ref 8.9–10.3)
Chloride: 103 mmol/L (ref 98–111)
Creatinine, Ser: 1.1 mg/dL — ABNORMAL HIGH (ref 0.44–1.00)
GFR, Estimated: >60 mL/min (ref >60)
Glucose, Bld: 101 mg/dL — ABNORMAL HIGH (ref 70–99)
Potassium: 3.9 mmol/L (ref 3.5–5.1)
Sodium: 136 mmol/L (ref 135–145)

## 2023-09-30 LAB — MAGNESIUM: Magnesium: 2 mg/dL (ref 1.7–2.4)

## 2023-09-30 LAB — GLUCOSE, CAPILLARY
Glucose-Capillary: 92 mg/dL (ref 70–99)
Glucose-Capillary: 95 mg/dL (ref 70–99)

## 2023-09-30 LAB — CORTISOL-AM, BLOOD: Cortisol - AM: 8.2 ug/dL (ref 6.7–22.6)

## 2023-09-30 MED ORDER — MIDODRINE HCL 10 MG PO TABS: 10.0000 mg | ORAL_TABLET | Freq: Three times a day (TID) | ORAL | 0 refills | Status: AC

## 2023-09-30 MED ORDER — POLYETHYLENE GLYCOL 3350 17 G PO PACK: 17.0000 g | PACK | Freq: Every day | ORAL | 0 refills | Status: AC

## 2023-09-30 NOTE — Plan of Care (Signed)

## 2023-09-30 NOTE — Discharge Summary (Signed)
 Physician Discharge Summary  Catherine Combs FMW:[id number] DOB: Nov 13, 1980 DOA: 09/26/2023  PCP: Toribio Jerel MATSU, MD  Admit date: 09/26/2023  Discharge date: 09/30/2023  Admitted From:Home  Disposition:  Home  Recommendations for Outpatient Follow-up:  Follow up with PCP in 1-2 weeks Remain on MiraLAX  daily as prescribed due to chronic constipation with opioid use Remain on midodrine  as prescribed for now due to soft blood pressure readings, and follow-up with PCP Continue other home medications as prior Incidental finding of enlarged right ovary needs to be followed up outpatient  Home Health: None  Equipment/Devices: None  Discharge Condition:Stable  CODE STATUS: Full  Diet recommendation: Regular diet  Brief/Interim Summary: 43 year old female with a history of SUDS, constipation, ADHD presenting with about 3-week history of abdominal pain and constipation. Small bowel study was negative for obstruction on 09/26/2021.  NG was removed on 09/28/2023.  She was started on clear liquid diet.  The patient continued to have some mild hypotension despite aggressive fluid resuscitation.  She was started on low-dose Levophed  on the early morning 09/28/2023.  This has now been weaned off and patient is on midodrine .  She has been started on soft diet per general surgery which she has been tolerating well.  Her blood pressures continue to remain soft, but she is not orthostatic or symptomatic from this in any way.  She is very eager for discharge and denies any abdominal pain, nausea, or vomiting and is passing flatus and having bowel movements.  No other acute events or concerns noted throughout the course of this admission.  Discharge Diagnoses:  Principal Problem:   SBO (small bowel obstruction) (HCC) Active Problems:   Sepsis secondary to UTI (HCC)   Lactic acidosis   Thrombocytosis   Elevated serum creatinine   Hyponatremia   SIRS (systemic inflammatory response syndrome) (HCC)   AKI  (acute kidney injury)  Principal discharge diagnosis: Ileus in the setting of chronic constipation as well as chronic pain.  Discharge Instructions  Discharge Instructions     Diet - low sodium heart healthy   Complete by: As directed    Increase activity slowly   Complete by: As directed       Allergies as of 09/30/2023       Reactions   Naloxone Nausea And Vomiting   & headaches   Hydrocodone  Itching, Nausea Only        Medication List     TAKE these medications    ALPRAZolam  1 MG tablet Commonly known as: XANAX  Take 1 mg by mouth 3 (three) times daily.   amphetamine-dextroamphetamine 20 MG tablet Commonly known as: ADDERALL Take 20 mg by mouth 2 (two) times daily.   buprenorphine  8 MG Subl SL tablet Commonly known as: SUBUTEX  Place 8 mg under the tongue 4 (four) times daily. Pt. States she takes a half tab 4 times a day   HYDROmorphone 8 MG tablet Commonly known as: DILAUDID Take 8 mg by mouth every 8 (eight) hours as needed for severe pain.   ibuprofen  800 MG tablet Commonly known as: ADVIL  Take 800 mg by mouth every 8 (eight) hours as needed.   methadone 10 MG tablet Commonly known as: DOLOPHINE Take 10 mg by mouth every 8 (eight) hours.   midodrine  10 MG tablet Commonly known as: PROAMATINE  Take 1 tablet (10 mg total) by mouth 3 (three) times daily with meals.   polyethylene glycol 17 g packet Commonly known as: MIRALAX  / GLYCOLAX  Take 17 g by mouth daily. Start taking  on: October 01, 2023        Follow-up Information     Toribio Jerel MATSU, MD. Schedule an appointment as soon as possible for a visit in 1 week(s).   Specialty: Family Medicine Contact information: [address] [place] [address] [phone]                Allergies  Allergen Reactions   Naloxone Nausea And Vomiting    & headaches   Hydrocodone  Itching and Nausea Only    Consultations: General Surgery   Procedures/Studies: DG Abd 1 View Result Date:  09/28/2023 CLINICAL DATA:  Small-bowel obstruction protocol. Based on available prior imaging, this represents an approximately 19 hour delayed image. EXAM: ABDOMEN - 1 VIEW COMPARISON:  CT 09/26/2023.  Radiographs 09/27/2023. FINDINGS: 0437 hours. Supine and erect views are submitted (3 total). Enteric tube projects into the proximal stomach. There is a small amount of residual contrast within the gastric fundus. Additional contrast has passed into the proximal colon which appears nondistended. No dilated loops of small bowel are identified. There is no evidence of contrast extravasation or pneumoperitoneum. Lower lung volumes with increased bibasilar pulmonary opacities, likely atelectasis. Possible small bilateral pleural effusions. Intrauterine device noted. The bones appear unremarkable. IMPRESSION: 1. No evidence of bowel obstruction or pneumoperitoneum. 2. Enteric tube projects into the proximal stomach. 3. Lower lung volumes with increased bibasilar atelectasis and possible small pleural effusions. Electronically Signed   By: Elsie Perone M.D.   On: 09/28/2023 08:39   DG Abd Portable 1V-Small Bowel Obstruction Protocol-initial, 8 hr delay Result Date: 09/27/2023 CLINICAL DATA:  8 hour delay, small-bowel obstruction. EXAM: DG ABD PORTABLE 1V COMPARISON:  None Available. FINDINGS: Contrast material seen within the stomach. Difficult to visualize contrast material elsewhere throughout the abdomen and pelvis. Gas within nondistended large and small bowel. No organomegaly or free air. IMPRESSION: Contrast within the stomach. Bowel-gas pattern nonspecific without clear evidence of bowel obstruction by plain film. Electronically Signed   By: Franky Crease M.D.   On: 09/27/2023 22:56   DG Chest Portable 1 View Result Date: 09/27/2023 CLINICAL DATA:  NGT placement film. EXAM: PORTABLE CHEST 1 VIEW COMPARISON:  Portable chest yesterday at 9:56 p.m. FINDINGS: 2:06 a.m. NGT has been inserted, adequately  intragastric with the tip in the proximal fundal area. The cardiac size is normal. The lungs are expiratory with bibasilar atelectasis without evidence of infiltrates or effusion. The mediastinum is normally outlined. Thoracic cage intact. Overlying telemetry leads. IMPRESSION: 1. NGT adequately intragastric with the tip in the proximal fundal area. 2. Expiratory chest with bibasilar atelectasis. Electronically Signed   By: Francis Quam M.D.   On: 09/27/2023 02:23   CT ABDOMEN PELVIS WO CONTRAST Result Date: 09/26/2023 CLINICAL DATA:  Left lower quadrant abdominal pain. EXAM: CT ABDOMEN AND PELVIS WITHOUT CONTRAST TECHNIQUE: Multidetector CT imaging of the abdomen and pelvis was performed following the standard protocol without IV contrast. RADIATION DOSE REDUCTION: This exam was performed according to the departmental dose-optimization program which includes automated exposure control, adjustment of the mA and/or kV according to patient size and/or use of iterative reconstruction technique. COMPARISON:  None Available. FINDINGS: Lower chest: There is minimal atelectasis in the lung bases. Hepatobiliary: The liver is enlarged. The gallbladder is dilated. Gallstones are likely present. There is mild gallbladder wall edema. There is no biliary ductal dilatation. Pancreas: Unremarkable. No pancreatic ductal dilatation or surrounding inflammatory changes. Spleen: Normal in size without focal abnormality. Adrenals/Urinary Tract: There is a cyst  in the right kidney measuring 13 mm. Otherwise, the adrenal glands and kidneys are within normal limits. There is a small amount of air in the bladder. Stomach/Bowel: The stomach is moderately distended. There are multiple dilated small bowel loops measuring up to 4.4 cm in the central and upper abdomen. Transition point is seen centrally in the mid abdomen. Distal small bowel loops are decompressed. Colon is normal in size. Appendix appears normal. There is a large amount of  stool throughout the colon. There is no free intraperitoneal air or pneumatosis. Vascular/Lymphatic: No significant vascular findings are present. No enlarged abdominal or pelvic lymph nodes. Reproductive: IUD is present, questionably low-lying. Uterus is otherwise within normal limits. The ovaries are not well defined secondary to free fluid in the pelvis. There is questionable enlarged right ovary. Other: There is a moderate amount of ascites and interloop fluid throughout the abdomen and pelvis. There is edema throughout the omentum. There is a small fat containing umbilical hernia. Musculoskeletal: No acute or significant osseous findings. IMPRESSION: 1. Findings compatible with small bowel obstruction with transition point in the mid abdomen. 2. Moderate amount of ascites and interloop fluid throughout the abdomen and pelvis. 3. Edema throughout the omentum. 4. Questionable enlarged right ovary. Recommend further evaluation with pelvic ultrasound. 5. IUD is present, questionably low-lying. 6. Hepatomegaly. 7. Dilated gallbladder with probable gallstones and mild gallbladder wall edema. Correlate clinically for cholecystitis. 8. Small amount of air in the bladder. Electronically Signed   By: Greig Pique M.D.   On: 09/26/2023 23:06   DG Chest Port 1 View Result Date: 09/26/2023 CLINICAL DATA:  Questionable sepsis - evaluate for abnormality EXAM: PORTABLE CHEST 1 VIEW COMPARISON:  None available FINDINGS: Low lung volumes, bibasilar atelectasis. No effusions. Heart and mediastinal contours within normal limits. IMPRESSION: Low lung volumes, bibasilar atelectasis. Electronically Signed   By: Franky Crease M.D.   On: 09/26/2023 22:18     Discharge Exam: Vitals:   09/30/23 0800 09/30/23 0838  BP: 110/71 (!) 93/59  Pulse: 97 94  Resp: 19 17  Temp:    SpO2: 93% 98%   Vitals:   09/30/23 0700 09/30/23 0716 09/30/23 0800 09/30/23 0838  BP: 92/66  110/71 (!) 93/59  Pulse: 98  97 94  Resp: 17  19 17    Temp:  99.1 F (37.3 C)    TempSrc:  Oral    SpO2: 93%  93% 98%    General: Pt is alert, awake, not in acute distress Cardiovascular: RRR, S1/S2 +, no rubs, no gallops Respiratory: CTA bilaterally, no wheezing, no rhonchi Abdominal: Soft, NT, ND, bowel sounds + Extremities: no edema, no cyanosis    The results of significant diagnostics from this hospitalization (including imaging, microbiology, ancillary and laboratory) are listed below for reference.     Microbiology: Recent Results (from the past 240 hours)  Resp panel by RT-PCR (RSV, Flu A&B, Covid) Anterior Nasal Swab     Status: None   Collection Time: 09/26/23  9:28 PM   Specimen: Anterior Nasal Swab  Result Value Ref Range Status   SARS Coronavirus 2 by RT PCR NEGATIVE NEGATIVE Final    Comment: (NOTE) SARS-CoV-2 target nucleic acids are NOT DETECTED.  The SARS-CoV-2 RNA is generally detectable in upper respiratory specimens during the acute phase of infection. The lowest concentration of SARS-CoV-2 viral copies this assay can detect is 138 copies/mL. A negative result does not preclude SARS-Cov-2 infection and should not be used as the sole basis for treatment or  other patient management decisions. A negative result may occur with  improper specimen collection/handling, submission of specimen other than nasopharyngeal swab, presence of viral mutation(s) within the areas targeted by this assay, and inadequate number of viral copies(<138 copies/mL). A negative result must be combined with clinical observations, patient history, and epidemiological information. The expected result is Negative.  Fact Sheet for Patients:  BloggerCourse.com  Fact Sheet for Healthcare Providers:  SeriousBroker.it  This test is no t yet approved or cleared by the [place]  FDA and  has been authorized for detection and/or diagnosis of SARS-CoV-2 by FDA under an Emergency Use  Authorization (EUA). This EUA will remain  in effect (meaning this test can be used) for the duration of the COVID-19 declaration under Section 564(b)(1) of the Act, 21 U.S.C.section 360bbb-3(b)(1), unless the authorization is terminated  or revoked sooner.       Influenza A by PCR NEGATIVE NEGATIVE Final   Influenza B by PCR NEGATIVE NEGATIVE Final    Comment: (NOTE) The Xpert Xpress SARS-CoV-2/FLU/RSV plus assay is intended as an aid in the diagnosis of influenza from Nasopharyngeal swab specimens and should not be used as a sole basis for treatment. Nasal washings and aspirates are unacceptable for Xpert Xpress SARS-CoV-2/FLU/RSV testing.  Fact Sheet for Patients: BloggerCourse.com  Fact Sheet for Healthcare Providers: SeriousBroker.it  This test is not yet approved or cleared by the [place]  FDA and has been authorized for detection and/or diagnosis of SARS-CoV-2 by FDA under an Emergency Use Authorization (EUA). This EUA will remain in effect (meaning this test can be used) for the duration of the COVID-19 declaration under Section 564(b)(1) of the Act, 21 U.S.C. section 360bbb-3(b)(1), unless the authorization is terminated or revoked.     Resp Syncytial Virus by PCR NEGATIVE NEGATIVE Final    Comment: (NOTE) Fact Sheet for Patients: BloggerCourse.com  Fact Sheet for Healthcare Providers: SeriousBroker.it  This test is not yet approved or cleared by the [place]  FDA and has been authorized for detection and/or diagnosis of SARS-CoV-2 by FDA under an Emergency Use Authorization (EUA). This EUA will remain in effect (meaning this test can be used) for the duration of the COVID-19 declaration under Section 564(b)(1) of the Act, 21 U.S.C. section 360bbb-3(b)(1), unless the authorization is terminated or revoked.  Performed at [facility], [address],  [place], [place] [address]   Blood Culture (routine x 2)     Status: None (Preliminary result)   Collection Time: 09/26/23  9:28 PM   Specimen: BLOOD LEFT HAND  Result Value Ref Range Status   Specimen Description BLOOD LEFT HAND  Final   Special Requests   Final    BOTTLES DRAWN AEROBIC AND ANAEROBIC Blood Culture adequate volume   Culture   Final    NO GROWTH 4 DAYS Performed at [facility], [address], [place], [place] [address]    Report Status PENDING  Incomplete  Blood Culture (routine x 2)     Status: None (Preliminary result)   Collection Time: 09/26/23  9:28 PM   Specimen: BLOOD RIGHT HAND  Result Value Ref Range Status   Specimen Description BLOOD RIGHT HAND  Final   Special Requests   Final    BOTTLES DRAWN AEROBIC AND ANAEROBIC Blood Culture adequate volume   Culture   Final    NO GROWTH 4 DAYS Performed at [facility], [address], [place], [place] [address]    Report Status PENDING  Incomplete  MRSA Next Gen by  PCR, Nasal     Status: None   Collection Time: 09/27/23  2:30 AM   Specimen: Nasal Mucosa; Nasal Swab  Result Value Ref Range Status   MRSA by PCR Next Gen NOT DETECTED NOT DETECTED Final    Comment: (NOTE) The GeneXpert MRSA Assay (FDA approved for NASAL specimens only), is one component of a comprehensive MRSA colonization surveillance program. It is not intended to diagnose MRSA infection nor to guide or monitor treatment for MRSA infections. Test performance is not FDA approved in patients less than 56 years old. Performed at [facility], [address], [place], [place] [address]   Urine Culture (for pregnant, neutropenic or urologic patients or patients with an indwelling urinary catheter)     Status: Abnormal   Collection Time: 09/27/23  6:00 PM   Specimen: Urine, Clean Catch  Result Value Ref Range Status   Specimen Description   Final    URINE, CLEAN CATCH Performed at [facility], [address] [place], [place] [address]    Special  Requests   Final    NONE Performed at [facility], [address] [place], [place] [address]    Culture MULTIPLE SPECIES PRESENT, SUGGEST RECOLLECTION (A)  Final   Report Status 09/29/2023 FINAL  Final  Culture, blood (Routine X 2) w Reflex to ID Panel     Status: None (Preliminary result)   Collection Time: 09/28/23 10:15 AM   Specimen: BLOOD  Result Value Ref Range Status   Specimen Description BLOOD BLOOD RIGHT HAND  Final   Special Requests   Final    BOTTLES DRAWN AEROBIC AND ANAEROBIC Blood Culture adequate volume   Culture   Final    NO GROWTH 2 DAYS Performed at [facility], [address] [place], [place] [address]    Report Status PENDING  Incomplete  Culture, blood (Routine X 2) w Reflex to ID Panel     Status: None (Preliminary result)   Collection Time: 09/28/23 10:17 AM   Specimen: BLOOD  Result Value Ref Range Status   Specimen Description BLOOD BLOOD RIGHT ARM  Final   Special Requests   Final    BOTTLES DRAWN AEROBIC AND ANAEROBIC Blood Culture adequate volume   Culture   Final    NO GROWTH 2 DAYS Performed at [facility], [address], [place], [place] [address]    Report Status PENDING  Incomplete     Labs: BNP (last 3 results) No results for input(s): BNP in the last 8760 hours. Basic Metabolic Panel: Recent Labs  Lab 09/26/23 2128 09/27/23 0351 09/28/23 0458 09/29/23 0456 09/30/23 0327  NA 131* 135 136 135 136  K 4.2 3.9 4.1 4.1 3.9  CL 93* 99 101 98 103  CO2 22 27 25 26 28   GLUCOSE 176* 117* 87 90 101*  BUN 32* 25* 22* 19 22*  CREATININE 2.22* 1.27* 0.96 1.00 1.10*  CALCIUM 8.7* 8.2* 7.7* 7.8* 7.7*  MG  --  1.6* 1.6* 2.2 2.0   Liver Function Tests: Recent Labs  Lab 09/26/23 2128 09/27/23 0351  AST 16 11*  ALT 11 9  ALKPHOS 88 74  BILITOT 1.0 0.7  PROT 7.9 6.6  ALBUMIN 2.7* 2.2*   No results for input(s): LIPASE, AMYLASE in the last 168 hours. No results for input(s): AMMONIA in the last 168 hours. CBC: Recent Labs   Lab 09/26/23 2128 09/27/23 0351 09/28/23 0458 09/29/23 0456 09/30/23 0327  WBC 14.3* 10.4 7.2 10.2 9.9  NEUTROABS 12.5*  --   --   --   --  HGB 11.9* 10.0* 7.6* 8.4* 8.2*  HCT 36.8 30.4* 24.1* 26.6* 26.8*  MCV 88.2 88.4 90.9 90.2 92.7  PLT 633* 460* 344 442* 445*   Cardiac Enzymes: No results for input(s): CKTOTAL, CKMB, CKMBINDEX, TROPONINI in the last 168 hours. BNP: Invalid input(s): POCBNP CBG: Recent Labs  Lab 09/29/23 1616 09/29/23 1938 09/29/23 2351 09/30/23 0400 09/30/23 0715  GLUCAP 88 137* 113* 92 95   D-Dimer No results for input(s): DDIMER in the last 72 hours. Hgb A1c No results for input(s): HGBA1C in the last 72 hours. Lipid Profile No results for input(s): CHOL, HDL, LDLCALC, TRIG, CHOLHDL, LDLDIRECT in the last 72 hours. Thyroid function studies No results for input(s): TSH, T4TOTAL, T3FREE, THYROIDAB in the last 72 hours.  Invalid input(s): FREET3 Anemia work up No results for input(s): VITAMINB12, FOLATE, FERRITIN, TIBC, IRON, RETICCTPCT in the last 72 hours. Urinalysis    Component Value Date/Time   COLORURINE AMBER (A) 09/27/2023 1800   APPEARANCEUR CLOUDY (A) 09/27/2023 1800   LABSPEC 1.030 09/27/2023 1800   PHURINE 6.0 09/27/2023 1800   GLUCOSEU NEGATIVE 09/27/2023 1800   HGBUR SMALL (A) 09/27/2023 1800   BILIRUBINUR NEGATIVE 09/27/2023 1800   KETONESUR NEGATIVE 09/27/2023 1800   PROTEINUR 100 (A) 09/27/2023 1800   UROBILINOGEN 0.2 08/15/2013 0319   NITRITE NEGATIVE 09/27/2023 1800   LEUKOCYTESUR MODERATE (A) 09/27/2023 1800   Sepsis Labs Recent Labs  Lab 09/27/23 0351 09/28/23 0458 09/29/23 0456 09/30/23 0327  WBC 10.4 7.2 10.2 9.9   Microbiology Recent Results (from the past 240 hours)  Resp panel by RT-PCR (RSV, Flu A&B, Covid) Anterior Nasal Swab     Status: None   Collection Time: 09/26/23  9:28 PM   Specimen: Anterior Nasal Swab  Result Value Ref Range Status   SARS  Coronavirus 2 by RT PCR NEGATIVE NEGATIVE Final    Comment: (NOTE) SARS-CoV-2 target nucleic acids are NOT DETECTED.  The SARS-CoV-2 RNA is generally detectable in upper respiratory specimens during the acute phase of infection. The lowest concentration of SARS-CoV-2 viral copies this assay can detect is 138 copies/mL. A negative result does not preclude SARS-Cov-2 infection and should not be used as the sole basis for treatment or other patient management decisions. A negative result may occur with  improper specimen collection/handling, submission of specimen other than nasopharyngeal swab, presence of viral mutation(s) within the areas targeted by this assay, and inadequate number of viral copies(<138 copies/mL). A negative result must be combined with clinical observations, patient history, and epidemiological information. The expected result is Negative.  Fact Sheet for Patients:  BloggerCourse.com  Fact Sheet for Healthcare Providers:  SeriousBroker.it  This test is no t yet approved or cleared by the [place]  FDA and  has been authorized for detection and/or diagnosis of SARS-CoV-2 by FDA under an Emergency Use Authorization (EUA). This EUA will remain  in effect (meaning this test can be used) for the duration of the COVID-19 declaration under Section 564(b)(1) of the Act, 21 U.S.C.section 360bbb-3(b)(1), unless the authorization is terminated  or revoked sooner.       Influenza A by PCR NEGATIVE NEGATIVE Final   Influenza B by PCR NEGATIVE NEGATIVE Final    Comment: (NOTE) The Xpert Xpress SARS-CoV-2/FLU/RSV plus assay is intended as an aid in the diagnosis of influenza from Nasopharyngeal swab specimens and should not be used as a sole basis for treatment. Nasal washings and aspirates are unacceptable for Xpert Xpress SARS-CoV-2/FLU/RSV testing.  Fact Sheet for  Patients: BloggerCourse.com  Fact Sheet for Healthcare Providers: SeriousBroker.it  This test is not yet approved or cleared by the [place]  FDA and has been authorized for detection and/or diagnosis of SARS-CoV-2 by FDA under an Emergency Use Authorization (EUA). This EUA will remain in effect (meaning this test can be used) for the duration of the COVID-19 declaration under Section 564(b)(1) of the Act, 21 U.S.C. section 360bbb-3(b)(1), unless the authorization is terminated or revoked.     Resp Syncytial Virus by PCR NEGATIVE NEGATIVE Final    Comment: (NOTE) Fact Sheet for Patients: BloggerCourse.com  Fact Sheet for Healthcare Providers: SeriousBroker.it  This test is not yet approved or cleared by the [place]  FDA and has been authorized for detection and/or diagnosis of SARS-CoV-2 by FDA under an Emergency Use Authorization (EUA). This EUA will remain in effect (meaning this test can be used) for the duration of the COVID-19 declaration under Section 564(b)(1) of the Act, 21 U.S.C. section 360bbb-3(b)(1), unless the authorization is terminated or revoked.  Performed at [facility], [address], [place], [place] [address]   Blood Culture (routine x 2)     Status: None (Preliminary result)   Collection Time: 09/26/23  9:28 PM   Specimen: BLOOD LEFT HAND  Result Value Ref Range Status   Specimen Description BLOOD LEFT HAND  Final   Special Requests   Final    BOTTLES DRAWN AEROBIC AND ANAEROBIC Blood Culture adequate volume   Culture   Final    NO GROWTH 4 DAYS Performed at [facility], [address] [place], [place] [address]    Report Status PENDING  Incomplete  Blood Culture (routine x 2)     Status: None (Preliminary result)   Collection Time: 09/26/23  9:28 PM   Specimen: BLOOD RIGHT HAND  Result Value Ref Range Status   Specimen Description BLOOD  RIGHT HAND  Final   Special Requests   Final    BOTTLES DRAWN AEROBIC AND ANAEROBIC Blood Culture adequate volume   Culture   Final    NO GROWTH 4 DAYS Performed at [facility], [address], [place], [place] [address]    Report Status PENDING  Incomplete  MRSA Next Gen by PCR, Nasal     Status: None   Collection Time: 09/27/23  2:30 AM   Specimen: Nasal Mucosa; Nasal Swab  Result Value Ref Range Status   MRSA by PCR Next Gen NOT DETECTED NOT DETECTED Final    Comment: (NOTE) The GeneXpert MRSA Assay (FDA approved for NASAL specimens only), is one component of a comprehensive MRSA colonization surveillance program. It is not intended to diagnose MRSA infection nor to guide or monitor treatment for MRSA infections. Test performance is not FDA approved in patients less than 32 years old. Performed at [facility], [address], [place], [place] [address]   Urine Culture (for pregnant, neutropenic or urologic patients or patients with an indwelling urinary catheter)     Status: Abnormal   Collection Time: 09/27/23  6:00 PM   Specimen: Urine, Clean Catch  Result Value Ref Range Status   Specimen Description   Final    URINE, CLEAN CATCH Performed at [facility], [address] [place], [place] [address]    Special Requests   Final    NONE Performed at [facility], [address] [place], [place] [address]    Culture MULTIPLE SPECIES PRESENT, SUGGEST RECOLLECTION (A)  Final   Report Status 09/29/2023 FINAL  Final  Culture, blood (Routine X  2) w Reflex to ID Panel     Status: None (Preliminary result)   Collection Time: 09/28/23 10:15 AM   Specimen: BLOOD  Result Value Ref Range Status   Specimen Description BLOOD BLOOD RIGHT HAND  Final   Special Requests   Final    BOTTLES DRAWN AEROBIC AND ANAEROBIC Blood Culture adequate volume   Culture   Final    NO GROWTH 2 DAYS Performed at [facility], [address] [place], [place] [address]    Report Status PENDING   Incomplete  Culture, blood (Routine X 2) w Reflex to ID Panel     Status: None (Preliminary result)   Collection Time: 09/28/23 10:17 AM   Specimen: BLOOD  Result Value Ref Range Status   Specimen Description BLOOD BLOOD RIGHT ARM  Final   Special Requests   Final    BOTTLES DRAWN AEROBIC AND ANAEROBIC Blood Culture adequate volume   Culture   Final    NO GROWTH 2 DAYS Performed at [facility], [address], [place], [place] [address]    Report Status PENDING  Incomplete     Time coordinating discharge: 35 minutes  SIGNED:   Adron JONETTA Fairly, DO [facility] 09/30/2023, 10:43 AM  If 7PM-7AM, please contact night-coverage [email or web address]

## 2023-09-30 NOTE — Progress Notes (Signed)
 Patient alert and oriented x4 and has been sitting up in bed/chair this morning. Tolerated diet and PO meds well, appetite fair. No complaints of pain, shortness of breath, chest pain, dizziness, nausea or vomiting. Patient up out of bed, ambulatory independently in room and to the bathroom with steady gait. IV's removed without complications. Dressings clean, dry and intact when discharged. Went over discharge summary, follow up information and medication education with patient. All questions answered and patient expressed full understanding of summary, information and education with teach back. Home medications (2 bottles) returned to patient from Pharmacy and patient double checked medications prior to leaving. Patient discharged with all belongings for home via car.

## 2023-09-30 NOTE — Progress Notes (Signed)
 [facility] Progress Note     Subjective: Catherine Combs is a 43 yo F with a history of substance use disorder and chronic constipation who presented to the emergency department because of a 3-week history of lower abdominal pain. General surgery was consulted for concern about SBO. On interview, she reports feeling much better. She reports that she has no pain after her recent pattern of bowel movements. She reports eating and sleeping well since yesterday. She states that she has been able to tolerate her soft food diet and liquids. She continues to endorse having good bowel movements and urine output. She denies any other fevers, chills, sweating, nausea, or vomiting. She reports denying her dulcolax since she has been having bowel movements regularly and would like to continue the miralax . She reports no other symptoms today.  Objective: Vital signs in last 24 hours: Temp:  [98 F (36.7 C)-99.1 F (37.3 C)] 99.1 F (37.3 C) (09/25 0716) Pulse Rate:  [83-98] 98 (09/25 0700) Resp:  [14-25] 17 (09/25 0700) BP: (74-107)/(46-66) 92/66 (09/25 0700) SpO2:  [88 %-97 %] 93 % (09/25 0700) Last BM Date : 09/29/23  Intake/Output from previous day: 09/24 0701 - 09/25 0700 In: 1026.2 [I.V.:889.7; IV Piggyback:136.5] Out: 500 [Urine:500] Intake/Output this shift: No intake/output data recorded.  General appearance: alert, cooperative, and no distress Resp: clear to auscultation bilaterally Cardio: regular rate and rhythm, S1, S2 normal, no murmur, click, rub or gallop GI: soft, non-tender; bowel sounds normal; no masses,  no organomegaly  Lab Results:  Recent Labs    09/29/23 0456 09/30/23 0327  WBC 10.2 9.9  HGB 8.4* 8.2*  HCT 26.6* 26.8*  PLT 442* 445*   BMET Recent Labs    09/29/23 0456 09/30/23 0327  NA 135 136  K 4.1 3.9  CL 98 103  CO2 26 28  GLUCOSE 90 101*  BUN 19 22*  CREATININE 1.00 1.10*  CALCIUM 7.8* 7.7*   PT/INR No results for input(s):  LABPROT, INR in the last 72 hours.  Studies/Results: No results found.  Anti-infectives: Anti-infectives (From admission, onward)    Start     Dose/Rate Route Frequency Ordered Stop   09/28/23 1400  piperacillin -tazobactam (ZOSYN ) IVPB 3.375 g        3.375 g 12.5 mL/hr over 240 Minutes Intravenous Every 8 hours 09/28/23 1117     09/26/23 2130  cefTRIAXone  (ROCEPHIN ) 2 g in sodium chloride  0.9 % 100 mL IVPB        2 g 200 mL/hr over 30 Minutes Intravenous Once 09/26/23 2121 09/26/23 2209       Assessment/Plan: s/p possible SBO  Catherine Combs is a 43 yo F with a history of substance use disorder and chronic constipation who was consulted for possible SBO. Today, she presents with no abdominal pain and regular bowel movement pattern after suppositories and miralax . She has been tolerating soft diet without return of her pain, systemic symptoms, or nausea/vomiting. Management will continue to be conservative with regularity of bowel movements and advancement of diet. - Advance diet to full diet - Supplement with miralax  - Continue to monitor patient's bowel movements - Patient is ready for discharge from a surgical standpoint as long as patient tolerates full diet; will defer to hospitalist for further care of the patient   LOS: 4 days    Elia LULLA Blanch 09/30/2023

## 2023-10-01 LAB — CULTURE, BLOOD (ROUTINE X 2)
Culture: NO GROWTH
Culture: NO GROWTH
Special Requests: ADEQUATE
Special Requests: ADEQUATE

## 2023-10-03 LAB — CULTURE, BLOOD (ROUTINE X 2)
Culture: NO GROWTH
Culture: NO GROWTH
Special Requests: ADEQUATE
Special Requests: ADEQUATE
